# Patient Record
Sex: Female | Born: 1990 | Race: White | Hispanic: No | Marital: Single | Smoking: Former smoker
Health system: Southern US, Community
[De-identification: ages and names within clinical notes are randomized; demographics above are authoritative.]

## PROBLEM LIST (undated history)

## (undated) DIAGNOSIS — F329 Major depressive disorder, single episode, unspecified: Secondary | ICD-10-CM

## (undated) DIAGNOSIS — M5126 Other intervertebral disc displacement, lumbar region: Secondary | ICD-10-CM

## (undated) DIAGNOSIS — N809 Endometriosis, unspecified: Secondary | ICD-10-CM

## (undated) DIAGNOSIS — E559 Vitamin D deficiency, unspecified: Secondary | ICD-10-CM

## (undated) DIAGNOSIS — F4310 Post-traumatic stress disorder, unspecified: Secondary | ICD-10-CM

## (undated) DIAGNOSIS — F32A Depression, unspecified: Secondary | ICD-10-CM

## (undated) DIAGNOSIS — R7989 Other specified abnormal findings of blood chemistry: Secondary | ICD-10-CM

## (undated) DIAGNOSIS — F1911 Other psychoactive substance abuse, in remission: Secondary | ICD-10-CM

## (undated) DIAGNOSIS — N83209 Unspecified ovarian cyst, unspecified side: Secondary | ICD-10-CM

## (undated) DIAGNOSIS — T7840XA Allergy, unspecified, initial encounter: Secondary | ICD-10-CM

## (undated) HISTORY — DX: Allergy, unspecified, initial encounter: T78.40XA

## (undated) HISTORY — PX: TONSILLECTOMY AND ADENOIDECTOMY: SUR1326

## (undated) HISTORY — DX: Other intervertebral disc displacement, lumbar region: M51.26

## (undated) HISTORY — DX: Post-traumatic stress disorder, unspecified: F43.10

## (undated) HISTORY — DX: Depression, unspecified: F32.A

## (undated) HISTORY — PX: LUMBAR EPIDURAL INJECTION: SHX1980

## (undated) HISTORY — DX: Endometriosis, unspecified: N80.9

## (undated) HISTORY — DX: Other psychoactive substance abuse, in remission: F19.11

## (undated) HISTORY — PX: OTHER SURGICAL HISTORY: SHX169

---
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
## 2019-01-23 ENCOUNTER — Ambulatory Visit (INDEPENDENT_AMBULATORY_CARE_PROVIDER_SITE_OTHER): Payer: PRIVATE HEALTH INSURANCE | Admitting: Family Medicine

## 2019-01-23 ENCOUNTER — Encounter: Payer: Self-pay | Admitting: Family Medicine

## 2019-01-23 ENCOUNTER — Other Ambulatory Visit: Payer: Self-pay

## 2019-01-23 VITALS — BP 100/72 | HR 89 | Temp 98.6°F | Ht 70.0 in | Wt 246.0 lb

## 2019-01-23 DIAGNOSIS — R61 Generalized hyperhidrosis: Secondary | ICD-10-CM

## 2019-01-23 DIAGNOSIS — Z7689 Persons encountering health services in other specified circumstances: Secondary | ICD-10-CM

## 2019-01-23 DIAGNOSIS — R251 Tremor, unspecified: Secondary | ICD-10-CM

## 2019-01-23 DIAGNOSIS — E669 Obesity, unspecified: Secondary | ICD-10-CM

## 2019-01-23 DIAGNOSIS — F329 Major depressive disorder, single episode, unspecified: Secondary | ICD-10-CM

## 2019-01-23 DIAGNOSIS — F4310 Post-traumatic stress disorder, unspecified: Secondary | ICD-10-CM

## 2019-01-23 DIAGNOSIS — F32A Depression, unspecified: Secondary | ICD-10-CM

## 2019-01-23 DIAGNOSIS — N809 Endometriosis, unspecified: Secondary | ICD-10-CM | POA: Insufficient documentation

## 2019-01-23 DIAGNOSIS — R42 Dizziness and giddiness: Secondary | ICD-10-CM | POA: Diagnosis not present

## 2019-01-23 DIAGNOSIS — R5383 Other fatigue: Secondary | ICD-10-CM | POA: Diagnosis not present

## 2019-01-23 DIAGNOSIS — F1911 Other psychoactive substance abuse, in remission: Secondary | ICD-10-CM

## 2019-01-23 DIAGNOSIS — R253 Fasciculation: Secondary | ICD-10-CM

## 2019-01-23 MED ORDER — GABAPENTIN 300 MG PO CAPS: 600.0000 mg | ORAL_CAPSULE | Freq: Every day | ORAL | 2 refills | Status: DC

## 2019-01-23 NOTE — Progress Notes (Signed)
New Patient Office Visit  Assessment & Plan:  1. Fatigue, unspecified type - Lab work to look for any identifiable cause of symptoms. Patient did request an increase in Celexa if nothing was found.  - CBC with Differential/Platelet - CMP14+EGFR - TSH - VITAMIN D 25 Hydroxy (Vit-D Deficiency, Fractures)  2. Dizziness - Lab work to look for any identifiable cause of symptoms. Symptom does resolve when patient eats and she does not have very good eating habits. Discussed eating regular healthy meals.  - CBC with Differential/Platelet - CMP14+EGFR - TSH  3. Diaphoresis - Discussed that this can be a side effect of several of her medications which she does not wish to stop.  - CBC with Differential/Platelet - CMP14+EGFR - TSH  4. Shakiness - Lab work to look for any identifiable cause of symptoms. Symptom does resolve when patient eats and she does not have very good eating habits. Discussed eating regular healthy meals.  - CBC with Differential/Platelet - CMP14+EGFR - TSH  5. Muscle twitching - Magnesium  6. Endometriosis - gabapentin (NEURONTIN) 300 MG capsule; Take 2 capsules (600 mg total) by mouth at bedtime.  Dispense: 60 capsule; Refill: 2 - Ambulatory referral to Obstetrics / Gynecology  7. PTSD (post-traumatic stress disorder) - Managed by Dr. Hoyle Barr at [facility].  - citalopram (CELEXA) 20 MG tablet; Take 20 mg by mouth daily. - doxepin (SINEQUAN) 10 MG capsule; Take 1 capsule by mouth at bedtime. - hydrOXYzine (ATARAX/VISTARIL) 25 MG tablet; Take 25 mg by mouth 3 (three) times daily as needed.  8. Chronic depression - Managed by Dr. Hoyle Barr at [facility]. Patient requesting an increase in Celexa if lab work is unrevealing. I explained that I do not like to change medications that are prescribed by other providers. She is very upset and states she has been trying to get in touch with them to move up her appointment but has not been able to get anyone to call her back. Her next  appointment is not scheduled until the middle of September.  - citalopram (CELEXA) 20 MG tablet; Take 20 mg by mouth daily. - doxepin (SINEQUAN) 10 MG capsule; Take 1 capsule by mouth at bedtime. - hydrOXYzine (ATARAX/VISTARIL) 25 MG tablet; Take 25 mg by mouth 3 (three) times daily as needed.  9. History of drug abuse [facility]) - Patient feels she is ready to wean off the Methadone but that the place she is going does not offer a blind wean, which she feels she needs. I encouraged her to call other clinics and see her options. I did give her the names and telephone numbers of a couple.  - methadone (DOLOPHINE) 10 MG/ML solution; Take 7.5 mLs by mouth daily.  10. Encounter to establish care  11. Obesity - Discussed that we cannot just start her on medication to help with weight loss when she is not doing anything to help herself. Encouraged exercise and dietary changes, both of which I provided education on.    Follow-up: Return as directed after labs result.   Hendricks Limes, MSN, APRN, FNP-C [facility]  Subjective:  [name], female    DOB: 05-Apr-1991  Age: 28 y.o. MRN: [id number]  Patient Care Team: Loman [place], FNP as PCP - General (Family Medicine) Dr. Hoyle Barr as Consulting Physician (Psychiatry) [facility] as Consulting Physician  CC:  Chief Complaint  Patient presents with  . Establish Care  . Medication Refill    HPI Elaine Lopez presents to  establish care. Patient recently moved from [place]. Patient also is in need of a refill on her gabapentin.   Patient has been taking gabapentin for pelvic pain from her severe endometriosis. She reports it was initially helpful but she feels the pain is getting worse. She is not sure if the pain is actually getting worse or if she has gotten use to taking the gabapentin and needs a higher dose. This was previously prescribed by her OBGYN but she has not yet established with one  in [place].   Patient also reports for the past couple of months her amount of fatigue has increased significantly. She associates it with worsening of her depression. She does get dizzy and shaky frequently when she hasn't eaten, which does subside when she eats. She is also starting to experience migraines on the left side of her head which start with a feeling of the hairs sticking up and numbness. She also reports a new twitching of the muscles in her left leg.   She is concerned about her weight gain since she has been clean and is requesting medication to help her. She knows she needed to gain weight but now she is feeling bad about herself because she has gained too much. She does not want to use meth again but does occasionally think that it would help her lose weight. She is not doing any form of exercise because of the increase in her depression/fatigue. She drinks a protein shake for breakfast. Some days she eats healthy foods like fruit and yogurt, but other days she may eat cereal or ice cream for a meal. She feels like eats bad because of her depression.    Review of Systems  Constitutional: Positive for diaphoresis (since getting clean) and malaise/fatigue. Negative for chills, fever and weight loss.  HENT: Negative for congestion, ear discharge, ear pain, nosebleeds, sinus pain, sore throat and tinnitus.   Eyes: Negative for blurred vision, double vision, pain, discharge and redness.  Respiratory: Negative for cough, shortness of breath and wheezing.   Cardiovascular: Negative for chest pain, palpitations and leg swelling.  Gastrointestinal: Negative for abdominal pain, constipation, diarrhea, heartburn, nausea and vomiting.  Genitourinary: Negative for dysuria, frequency and urgency.  Musculoskeletal: Negative for myalgias.  Skin: Negative for rash.  Neurological: Positive for dizziness and headaches. Negative for seizures and weakness.  Psychiatric/Behavioral: Positive for depression.  Negative for substance abuse and suicidal ideas. The patient is not nervous/anxious.      Current Outpatient Medications:  .  citalopram (CELEXA) 20 MG tablet, Take 20 mg by mouth daily., Disp: , Rfl:  .  doxepin (SINEQUAN) 10 MG capsule, Take 1 capsule by mouth at bedtime., Disp: , Rfl:  .  gabapentin (NEURONTIN) 300 MG capsule, Take 2 capsules (600 mg total) by mouth at bedtime., Disp: 60 capsule, Rfl: 2 .  hydrOXYzine (ATARAX/VISTARIL) 25 MG tablet, Take 25 mg by mouth 3 (three) times daily as needed., Disp: , Rfl:  .  Melatonin 10 MG CAPS, Take 1 capsule by mouth at bedtime., Disp: , Rfl:  .  methadone (DOLOPHINE) 10 MG/ML solution, Take 7.5 mLs by mouth daily., Disp: , Rfl:  .  Multiple Vitamin (MULTIVITAMIN WITH MINERALS) TABS tablet, Take 1 tablet by mouth daily., Disp: , Rfl:   No Known Allergies  Past Medical History:  Diagnosis Date  . Allergy   . Chronic depression   . Endometriosis    stage 4   . History of drug abuse ([place])   .  Lumbar herniated disc    x3  . PTSD (post-traumatic stress disorder)     Past Surgical History:  Procedure Laterality Date  . laproscopy     x3  . LUMBAR EPIDURAL INJECTION    . TONSILLECTOMY AND ADENOIDECTOMY      Family History  Problem Relation Age of Onset  . Thyroid disease Mother   . Hypertension Mother   . Depression Mother   . Vascular Disease Mother   . Depression Father   . Cancer Sister        ewings sarcoma  . Heart attack Maternal Grandfather   . COPD Paternal Grandfather   . Heart disease Paternal Grandfather     Social History   Socioeconomic History  . Marital status: Single    Spouse name: Not on file  . Number of children: 0  . Years of education: Not on file  . 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 Financial resource strain: Not on file  . Food insecurity    Worry: Not on file    Inability: Not on file  . Transportation needs    Medical:  Not on file    Non-medical: Not on file  Tobacco Use  . Smoking status: Current Some Day Smoker    Packs/day: 0.10  . Smokeless tobacco: Never Used  . Tobacco comment: maybe one a day   Substance and Sexual Activity  . Alcohol use: Not Currently  . 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  Marland Kitchen Sexual activity: Not on file  Lifestyle  . Physical activity    Days per week: Not on file    Minutes per session: Not on file  . Stress: Not on file  Relationships  . Social Herbalist on phone: Not on file    Gets together: Not on file    Attends religious service: Not on file    Active member of club or organization: Not on file    Attends meetings of clubs or organizations: Not on file    Relationship status: Not on file  . Intimate partner violence    Fear of current or ex partner: Not on file    Emotionally abused: Not on file    Physically abused: Not on file    Forced sexual activity: Not on file  Other Topics Concern  . Not on file  Social History Narrative  . Not on file    Objective:   Today's Vitals: BP 100/72   Pulse 89   Temp 98.6 F (37 C)   Ht _0  (1.778 m)   Wt 246 lb (111.6 kg)   LMP 01/09/2019 Comment: IUD   BMI 35.30 kg/m   Physical Exam Vitals signs reviewed.  Constitutional:      General: She is not in acute distress.    Appearance: Normal appearance. She is obese. She is not ill-appearing, toxic-appearing or diaphoretic.  HENT:     Head: Normocephalic and atraumatic.     Right Ear: Tympanic membrane, ear canal and external ear normal. There is no impacted cerumen.     Left Ear: Tympanic membrane, ear canal and external ear normal. There is no impacted cerumen.     Nose: Nose normal. No congestion or rhinorrhea.     Mouth/Throat:     Mouth: Mucous membranes are moist.  Pharynx: Oropharynx is clear. No oropharyngeal exudate or posterior oropharyngeal erythema.  Eyes:      General: No scleral icterus.       Right eye: No discharge.        Left eye: No discharge.     Conjunctiva/sclera: Conjunctivae normal.     Pupils: Pupils are equal, round, and reactive to light.  Neck:     Musculoskeletal: Normal range of motion and neck supple. No neck rigidity or muscular tenderness.  Cardiovascular:     Rate and Rhythm: Normal rate and regular rhythm.     Heart sounds: Normal heart sounds. No murmur. No friction rub. No gallop.   Pulmonary:     Effort: Pulmonary effort is normal. No respiratory distress.     Breath sounds: Normal breath sounds. No stridor. No wheezing, rhonchi or rales.  Abdominal:     General: Abdomen is flat. Bowel sounds are normal. There is no distension.     Palpations: Abdomen is soft. There is no mass.     Tenderness: There is no abdominal tenderness. There is no guarding or rebound.     Hernia: No hernia is present.  Musculoskeletal: Normal range of motion.  Lymphadenopathy:     Cervical: No cervical adenopathy.  Skin:    General: Skin is warm and dry.     Capillary Refill: Capillary refill takes less than 2 seconds.  Neurological:     General: No focal deficit present.     Mental Status: She is alert and oriented to person, place, and time. Mental status is at baseline.  Psychiatric:        Mood and Affect: Mood normal.        Behavior: Behavior normal.        Thought Content: Thought content normal.        Judgment: Judgment normal.

## 2019-01-23 NOTE — Patient Instructions (Addendum)
[facility] [place] [phone]  [facility] [place] - Dr. Tollie EthPlummer [phone]  Calorie Counting for Weight Loss Calories are units of energy. Your body needs a certain amount of calories from food to keep you going throughout the day. When you eat more calories than your body needs, your body stores the extra calories as fat. When you eat fewer calories than your body needs, your body burns fat to get the energy it needs. Calorie counting means keeping track of how many calories you eat and drink each day. Calorie counting can be helpful if you need to lose weight. If you make sure to eat fewer calories than your body needs, you should lose weight. Ask your health care provider what a healthy weight is for you. For calorie counting to work, you will need to eat the right number of calories in a day in order to lose a healthy amount of weight per week. A dietitian can help you determine how many calories you need in a day and will give you suggestions on how to reach your calorie goal.  A healthy amount of weight to lose per week is usually 1-2 lb (0.5-0.9 kg). This usually means that your daily calorie intake should be reduced by 500-750 calories.  Eating 1,200 - 1,500 calories per day can help most women lose weight.  Eating 1,500 - 1,800 calories per day can help most men lose weight. What is my plan? My goal is to have __________ calories per day. If I have this many calories per day, I should lose around __________ pounds per week. What do I need to know about calorie counting? In order to meet your daily calorie goal, you will need to:  Find out how many calories are in each food you would like to eat. Try to do this before you eat.  Decide how much of the food you plan to eat.  Write down what you ate and how many calories it had. Doing this is called keeping a food log. To successfully lose weight, it is important to balance calorie counting  with a healthy lifestyle that includes regular activity. Aim for 150 minutes of moderate exercise (such as walking) or 75 minutes of vigorous exercise (such as running) each week. Where do I find calorie information?  The number of calories in a food can be found on a Nutrition Facts label. If a food does not have a Nutrition Facts label, try to look up the calories online or ask your dietitian for help. Remember that calories are listed per serving. If you choose to have more than one serving of a food, you will have to multiply the calories per serving by the amount of servings you plan to eat. For example, the label on a package of bread might say that a serving size is 1 slice and that there are 90 calories in a serving. If you eat 1 slice, you will have eaten 90 calories. If you eat 2 slices, you will have eaten 180 calories. How do I keep a food log? Immediately after each meal, record the following information in your food log:  What you ate. Don't forget to include toppings, sauces, and other extras on the food.  How much you ate. This can be measured in cups, ounces, or number of items.  How many calories each food and drink had.  The total number of calories in the meal. Keep your food log near  you, such as in a small notebook in your pocket, or use a mobile app or website. Some programs will calculate calories for you and show you how many calories you have left for the day to meet your goal. What are some calorie counting tips?   Use your calories on foods and drinks that will fill you up and not leave you hungry: ? Some examples of foods that fill you up are nuts and nut butters, vegetables, lean proteins, and high-fiber foods like whole grains. High-fiber foods are foods with more than 5 g fiber per serving. ? Drinks such as sodas, specialty coffee drinks, alcohol, and juices have a lot of calories, yet do not fill you up.  Eat nutritious foods and avoid empty calories. Empty  calories are calories you get from foods or beverages that do not have many vitamins or protein, such as candy, sweets, and soda. It is better to have a nutritious high-calorie food (such as an avocado) than a food with few nutrients (such as a bag of chips).  Know how many calories are in the foods you eat most often. This will help you calculate calorie counts faster.  Pay attention to calories in drinks. Low-calorie drinks include water and unsweetened drinks.  Pay attention to nutrition labels for "low fat" or "fat free" foods. These foods sometimes have the same amount of calories or more calories than the full fat versions. They also often have added sugar, starch, or salt, to make up for flavor that was removed with the fat.  Find a way of tracking calories that works for you. Get creative. Try different apps or programs if writing down calories does not work for you. What are some portion control tips?  Know how many calories are in a serving. This will help you know how many servings of a certain food you can have.  Use a measuring cup to measure serving sizes. You could also try weighing out portions on a kitchen scale. With time, you will be able to estimate serving sizes for some foods.  Take some time to put servings of different foods on your favorite plates, bowls, and cups so you know what a serving looks like.  Try not to eat straight from a bag or box. Doing this can lead to overeating. Put the amount you would like to eat in a cup or on a plate to make sure you are eating the right portion.  Use smaller plates, glasses, and bowls to prevent overeating.  Try not to multitask (for example, watch TV or use your computer) while eating. If it is time to eat, sit down at a table and enjoy your food. This will help you to know when you are full. It will also help you to be aware of what you are eating and how much you are eating. What are tips for following this plan? Reading food  labels  Check the calorie count compared to the serving size. The serving size may be smaller than what you are used to eating.  Check the source of the calories. Make sure the food you are eating is high in vitamins and protein and low in saturated and trans fats. Shopping  Read nutrition labels while you shop. This will help you make healthy decisions before you decide to purchase your food.  Make a grocery list and stick to it. Cooking  Try to cook your favorite foods in a healthier way. For example, try baking instead of  frying.  Use low-fat dairy products. Meal planning  Use more fruits and vegetables. Half of your plate should be fruits and vegetables.  Include lean proteins like poultry and fish. How do I count calories when eating out?  Ask for smaller portion sizes.  Consider sharing an entree and sides instead of getting your own entree.  If you get your own entree, eat only half. Ask for a box at the beginning of your meal and put the rest of your entree in it so you are not tempted to eat it.  If calories are listed on the menu, choose the lower calorie options.  Choose dishes that include vegetables, fruits, whole grains, low-fat dairy products, and lean protein.  Choose items that are boiled, broiled, grilled, or steamed. Stay away from items that are buttered, battered, fried, or served with cream sauce. Items labeled "crispy" are usually fried, unless stated otherwise.  Choose water, low-fat milk, unsweetened iced tea, or other drinks without added sugar. If you want an alcoholic beverage, choose a lower calorie option such as a glass of wine or light beer.  Ask for dressings, sauces, and syrups on the side. These are usually high in calories, so you should limit the amount you eat.  If you want a salad, choose a garden salad and ask for grilled meats. Avoid extra toppings like bacon, cheese, or fried items. Ask for the dressing on the side, or ask for olive oil  and vinegar or lemon to use as dressing.  Estimate how many servings of a food you are given. For example, a serving of cooked rice is  cup or about the size of half a baseball. Knowing serving sizes will help you be aware of how much food you are eating at restaurants. The list below tells you how big or small some common portion sizes are based on everyday objects: ? 1 oz-4 stacked dice. ? 3 oz-1 deck of cards. ? 1 tsp-1 die. ? 1 Tbsp- a ping-pong ball. ? 2 Tbsp-1 ping-pong ball. ?  cup- baseball. ? 1 cup-1 baseball. Summary  Calorie counting means keeping track of how many calories you eat and drink each day. If you eat fewer calories than your body needs, you should lose weight.  A healthy amount of weight to lose per week is usually 1-2 lb (0.5-0.9 kg). This usually means reducing your daily calorie intake by 500-750 calories.  The number of calories in a food can be found on a Nutrition Facts label. If a food does not have a Nutrition Facts label, try to look up the calories online or ask your dietitian for help.  Use your calories on foods and drinks that will fill you up, and not on foods and drinks that will leave you hungry.  Use smaller plates, glasses, and bowls to prevent overeating. This information is not intended to replace advice given to you by your health care provider. Make sure you discuss any questions you have with your health care provider. Document Released: 06/04/2005 Document Revised: 02/21/2018 Document Reviewed: 05/04/2016 Elsevier Patient Education  2020 [employer].  Exercising to Lose Weight Exercise is structured, repetitive physical activity to improve fitness and health. Getting regular exercise is important for everyone. It is especially important if you are overweight. Being overweight increases your risk of heart disease, stroke, diabetes, high blood pressure, and several types of cancer. Reducing your calorie intake and exercising can help you  lose weight. Exercise is usually categorized as moderate or  vigorous intensity. To lose weight, most people need to do a certain amount of moderate-intensity or vigorous-intensity exercise each week. Moderate-intensity exercise  Moderate-intensity exercise is any activity that gets you moving enough to burn at least three times more energy (calories) than if you were sitting. Examples of moderate exercise include:  Walking a mile in 15 minutes.  Doing light yard work.  Biking at an easy pace. Most people should get at least 150 minutes (2 hours and 30 minutes) a week of moderate-intensity exercise to maintain their body weight. Vigorous-intensity exercise Vigorous-intensity exercise is any activity that gets you moving enough to burn at least six times more calories than if you were sitting. When you exercise at this intensity, you should be working hard enough that you are not able to carry on a conversation. Examples of vigorous exercise include:  Running.  Playing a team sport, such as football, basketball, and soccer.  Jumping rope. Most people should get at least 75 minutes (1 hour and 15 minutes) a week of vigorous-intensity exercise to maintain their body weight. How can exercise affect me? When you exercise enough to burn more calories than you eat, you lose weight. Exercise also reduces body fat and builds muscle. The more muscle you have, the more calories you burn. Exercise also:  Improves mood.  Reduces stress and tension.  Improves your overall fitness, flexibility, and endurance.  Increases bone strength. The amount of exercise you need to lose weight depends on:  Your age.  The type of exercise.  Any health conditions you have.  Your overall physical ability. Talk to your health care provider about how much exercise you need and what types of activities are safe for you. What actions can I take to lose weight? Nutrition   Make changes to your diet as told  by your health care provider or diet and nutrition specialist (dietitian). This may include: ? Eating fewer calories. ? Eating more protein. ? Eating less unhealthy fats. ? Eating a diet that includes fresh fruits and vegetables, whole grains, low-fat dairy products, and lean protein. ? Avoiding foods with added fat, salt, and sugar.  Drink plenty of water while you exercise to prevent dehydration or heat stroke. Activity  Choose an activity that you enjoy and set realistic goals. Your health care provider can help you make an exercise plan that works for you.  Exercise at a moderate or vigorous intensity most days of the week. ? The intensity of exercise may vary from person to person. You can tell how intense a workout is for you by paying attention to your breathing and heartbeat. Most people will notice their breathing and heartbeat get faster with more intense exercise.  Do resistance training twice each week, such as: ? Push-ups. ? Sit-ups. ? Lifting weights. ? Using resistance bands.  Getting short amounts of exercise can be just as helpful as long structured periods of exercise. If you have trouble finding time to exercise, try to include exercise in your daily routine. ? Get up, stretch, and walk around every 30 minutes throughout the day. ? Go for a walk during your lunch break. ? Park your car farther away from your destination. ? If you take public transportation, get off one stop early and walk the rest of the way. ? Make phone calls while standing up and walking around. ? Take the stairs instead of elevators or escalators.  Wear comfortable clothes and shoes with good support.  Do not exercise so much  that you hurt yourself, feel dizzy, or get very short of breath. Where to find more information  U.S. Department of Health and Human Services: ThisPath.fiwww.hhs.gov  Centers for Disease Control and Prevention (CDC): FootballExhibition.com.brwww.cdc.gov Contact a health care provider:  Before starting  a new exercise program.  If you have questions or concerns about your weight.  If you have a medical problem that keeps you from exercising. Get help right away if you have any of the following while exercising:  Injury.  Dizziness.  Difficulty breathing or shortness of breath that does not go away when you stop exercising.  Chest pain.  Rapid heartbeat. Summary  Being overweight increases your risk of heart disease, stroke, diabetes, high blood pressure, and several types of cancer.  Losing weight happens when you burn more calories than you eat.  Reducing the amount of calories you eat in addition to getting regular moderate or vigorous exercise each week helps you lose weight. This information is not intended to replace advice given to you by your health care provider. Make sure you discuss any questions you have with your health care provider. Document Released: 07/07/2010 Document Revised: 06/17/2017 Document Reviewed: 06/17/2017 Elsevier Patient Education  2020 [employer]

## 2019-01-24 ENCOUNTER — Encounter: Payer: Self-pay | Admitting: Family Medicine

## 2019-01-24 DIAGNOSIS — F32A Depression, unspecified: Secondary | ICD-10-CM | POA: Insufficient documentation

## 2019-01-24 DIAGNOSIS — F4310 Post-traumatic stress disorder, unspecified: Secondary | ICD-10-CM | POA: Insufficient documentation

## 2019-01-24 DIAGNOSIS — F329 Major depressive disorder, single episode, unspecified: Secondary | ICD-10-CM | POA: Insufficient documentation

## 2019-01-24 DIAGNOSIS — F1911 Other psychoactive substance abuse, in remission: Secondary | ICD-10-CM | POA: Insufficient documentation

## 2019-01-24 LAB — CMP14+EGFR
ALT: 26 IU/L (ref 0–32)
AST: 30 IU/L (ref 0–40)
Albumin/Globulin Ratio: 1.7 (ref 1.2–2.2)
Albumin: 4.5 g/dL (ref 3.9–5.0)
Alkaline Phosphatase: 113 IU/L (ref 39–117)
BUN/Creatinine Ratio: 19 (ref 9–23)
BUN: 11 mg/dL (ref 6–20)
Bilirubin Total: <0.2 mg/dL (ref 0.0–1.2)
CO2: 22 mmol/L (ref 20–29)
Calcium: 9.7 mg/dL (ref 8.7–10.2)
Chloride: 100 mmol/L (ref 96–106)
Creatinine, Ser: 0.59 mg/dL (ref 0.57–1.00)
GFR calc Af Amer: 146 mL/min/{1.73_m2} (ref >59)
GFR calc non Af Amer: 127 mL/min/{1.73_m2} (ref >59)
Globulin, Total: 2.7 g/dL (ref 1.5–4.5)
Glucose: 94 mg/dL (ref 65–99)
Potassium: 4.5 mmol/L (ref 3.5–5.2)
Sodium: 137 mmol/L (ref 134–144)
Total Protein: 7.2 g/dL (ref 6.0–8.5)

## 2019-01-24 LAB — CBC WITH DIFFERENTIAL/PLATELET
Basophils Absolute: 0.1 10*3/uL (ref 0.0–0.2)
Basos: 1 %
EOS (ABSOLUTE): 0.3 10*3/uL (ref 0.0–0.4)
Eos: 5 %
Hematocrit: 37.1 % (ref 34.0–46.6)
Hemoglobin: 12.7 g/dL (ref 11.1–15.9)
Immature Grans (Abs): 0 10*3/uL (ref 0.0–0.1)
Immature Granulocytes: 0 %
Lymphocytes Absolute: 2.4 10*3/uL (ref 0.7–3.1)
Lymphs: 36 %
MCH: 28.4 pg (ref 26.6–33.0)
MCHC: 34.2 g/dL (ref 31.5–35.7)
MCV: 83 fL (ref 79–97)
Monocytes Absolute: 0.7 10*3/uL (ref 0.1–0.9)
Monocytes: 11 %
Neutrophils Absolute: 3.1 10*3/uL (ref 1.4–7.0)
Neutrophils: 47 %
Platelets: 332 10*3/uL (ref 150–450)
RBC: 4.47 x10E6/uL (ref 3.77–5.28)
RDW: 12.1 % (ref 11.7–15.4)
WBC: 6.5 10*3/uL (ref 3.4–10.8)

## 2019-01-24 LAB — VITAMIN D 25 HYDROXY (VIT D DEFICIENCY, FRACTURES): Vit D, 25-Hydroxy: 27.8 ng/mL — ABNORMAL LOW (ref 30.0–100.0)

## 2019-01-24 LAB — TSH: TSH: 6.22 u[IU]/mL — ABNORMAL HIGH (ref 0.450–4.500)

## 2019-01-24 LAB — MAGNESIUM: Magnesium: 1.8 mg/dL (ref 1.6–2.3)

## 2019-01-26 ENCOUNTER — Encounter: Payer: Self-pay | Admitting: Family Medicine

## 2019-01-26 DIAGNOSIS — R7989 Other specified abnormal findings of blood chemistry: Secondary | ICD-10-CM

## 2019-01-26 DIAGNOSIS — E559 Vitamin D deficiency, unspecified: Secondary | ICD-10-CM

## 2019-01-26 HISTORY — DX: Other specified abnormal findings of blood chemistry: R79.89

## 2019-01-26 HISTORY — DX: Vitamin D deficiency, unspecified: E55.9

## 2019-01-27 ENCOUNTER — Telehealth: Payer: Self-pay | Admitting: Family Medicine

## 2019-01-27 DIAGNOSIS — F4310 Post-traumatic stress disorder, unspecified: Secondary | ICD-10-CM

## 2019-01-27 DIAGNOSIS — F329 Major depressive disorder, single episode, unspecified: Secondary | ICD-10-CM

## 2019-01-27 DIAGNOSIS — F32A Depression, unspecified: Secondary | ICD-10-CM

## 2019-01-27 DIAGNOSIS — N809 Endometriosis, unspecified: Secondary | ICD-10-CM

## 2019-01-27 MED ORDER — CITALOPRAM HYDROBROMIDE 40 MG PO TABS: 40.0000 mg | ORAL_TABLET | Freq: Every day | ORAL | 1 refills | Status: DC

## 2019-01-27 NOTE — Addendum Note (Signed)
Addended by: Loman Brooklyn on: 01/27/2019 03:40 PM   Modules accepted: Orders

## 2019-01-27 NOTE — Telephone Encounter (Signed)
Patient called regarding lab result message received on MyChart.  Patient states that Celexa 10mg  was changed to 20mg  on 11/26/2018.  She also states that she would like to increase gabapentin from 600mg  to 900mg  and would like to know since labs have  came back can she be started on weight loss medication.   [facility] and would like to know if provider can recommend another doctor.

## 2019-01-27 NOTE — Telephone Encounter (Signed)
Patient aware.

## 2019-01-27 NOTE — Telephone Encounter (Signed)
I am willing to increase the Celexa to 40 mg once daily but she should make sure to follow-up with [facility]. I sent in a 40 mg tablet but she can take two of the 20 mg tablets to use them up. Just make sure she knows when she goes to pick up the new prescription she only needs to take one tablet since it is the 40 mg.   I referred to [facility] for pelvic pain as I have never treated endometriosis with gabapentin and wanted their input. I will place referral to [facility] in [place] since [place] does not accept her insurance.   She needs to work on her diet and incorporating exercise into her routine. The medication only helps if she is doing these things. She has to be trying before these medications will be prescribed. We can do a follow-up in 3 months to see how she has done with diet and exercise. I am sorry, I thought I explained that well enough when she was here.

## 2019-02-10 ENCOUNTER — Telehealth: Payer: Self-pay | Admitting: Family Medicine

## 2019-04-20 ENCOUNTER — Other Ambulatory Visit: Payer: Self-pay | Admitting: Family Medicine

## 2019-04-20 DIAGNOSIS — N809 Endometriosis, unspecified: Secondary | ICD-10-CM

## 2019-04-20 DIAGNOSIS — F329 Major depressive disorder, single episode, unspecified: Secondary | ICD-10-CM

## 2019-04-20 DIAGNOSIS — F4310 Post-traumatic stress disorder, unspecified: Secondary | ICD-10-CM

## 2019-04-20 DIAGNOSIS — F32A Depression, unspecified: Secondary | ICD-10-CM

## 2019-04-20 NOTE — Telephone Encounter (Signed)
Has she seen an OBGYN? She was supposed to see them for her endometriosis as this is what she was taking the gabapentin for. Also, has she seen her psychiatrist? I increased and she was to follow-up with him. Please let me know so I can figure out refills.

## 2019-04-21 MED ORDER — CITALOPRAM HYDROBROMIDE 40 MG PO TABS: 40.0000 mg | ORAL_TABLET | Freq: Every day | ORAL | 2 refills | Status: DC

## 2019-04-21 MED ORDER — GABAPENTIN 300 MG PO CAPS: 600.0000 mg | ORAL_CAPSULE | Freq: Every day | ORAL | 2 refills | Status: DC

## 2019-04-21 NOTE — Telephone Encounter (Signed)
Patient has not been able to se OBGYN & psychiatrist due to her work scheduled.

## 2019-04-21 NOTE — Telephone Encounter (Signed)
Refills sent

## 2019-04-23 NOTE — Telephone Encounter (Signed)
Can we please ask patient to at least schedule a f/u of depression with me since she has not made it to her psychiatrist? We can do this as a telephone visit if she is agreeable.

## 2019-04-23 NOTE — Telephone Encounter (Signed)
Pt aware and will call back tomorrow and schedule an appt

## 2019-05-29 ENCOUNTER — Telehealth: Payer: Self-pay | Admitting: Family Medicine

## 2019-05-29 ENCOUNTER — Other Ambulatory Visit: Payer: Self-pay | Admitting: Family Medicine

## 2019-05-29 DIAGNOSIS — F32A Depression, unspecified: Secondary | ICD-10-CM

## 2019-05-29 DIAGNOSIS — F4310 Post-traumatic stress disorder, unspecified: Secondary | ICD-10-CM

## 2019-05-29 DIAGNOSIS — F329 Major depressive disorder, single episode, unspecified: Secondary | ICD-10-CM

## 2019-05-29 MED ORDER — HYDROXYZINE HCL 25 MG PO TABS: 25.0000 mg | ORAL_TABLET | Freq: Three times a day (TID) | ORAL | 0 refills | Status: DC | PRN

## 2019-05-29 NOTE — Telephone Encounter (Signed)
Sent.  Please have f/u w/ PCP scheduled

## 2019-06-01 ENCOUNTER — Ambulatory Visit (INDEPENDENT_AMBULATORY_CARE_PROVIDER_SITE_OTHER): Payer: PRIVATE HEALTH INSURANCE | Admitting: Family Medicine

## 2019-06-01 ENCOUNTER — Encounter: Payer: Self-pay | Admitting: Family Medicine

## 2019-06-01 DIAGNOSIS — F329 Major depressive disorder, single episode, unspecified: Secondary | ICD-10-CM | POA: Diagnosis not present

## 2019-06-01 DIAGNOSIS — F4310 Post-traumatic stress disorder, unspecified: Secondary | ICD-10-CM

## 2019-06-01 DIAGNOSIS — F1911 Other psychoactive substance abuse, in remission: Secondary | ICD-10-CM

## 2019-06-01 DIAGNOSIS — E669 Obesity, unspecified: Secondary | ICD-10-CM | POA: Diagnosis not present

## 2019-06-01 DIAGNOSIS — F32A Depression, unspecified: Secondary | ICD-10-CM

## 2019-06-01 MED ORDER — BUPROPION HCL ER (XL) 150 MG PO TB24: 150.0000 mg | ORAL_TABLET | Freq: Every day | ORAL | 2 refills | Status: DC

## 2019-06-01 NOTE — Progress Notes (Signed)
Virtual Visit via Telephone Note  I connected with Elaine Lopez on 06/01/19 at 1:46 PM by telephone and verified that I am speaking with the correct person using two identifiers. Elaine Lopez is currently located at home and nobody is currently with her during this visit. The provider, Loman Brooklyn, FNP is located in their home at time of visit.  I discussed the limitations, risks, security and privacy concerns of performing an evaluation and management service by telephone and the availability of in person appointments. I also discussed with the patient that there may be a patient responsible charge related to this service. The patient expressed understanding and agreed to proceed.  Subjective: PCP: Loman Brooklyn, FNP  Chief Complaint  Patient presents with  . Depression   Patient reports he feels like her depression is doing better with an increase in Celexa.  She reports she has also been seeing a chiropractor and since she is having a lot less pain it has boosted her mood.  In addition she has got a puppy which is also helping.  She reports she still has moments where she just sits and stares feeling like she is reliving the past which makes her feel down but she is able to pull herself out of it.  She does still have nightmares at times.  She reports she cannot recall the entire nightmare the next day but will sometimes have what she feels like are flashbacks to the nightmares which are very scary for her.  She spoke to [facility] today and reports that she was told she has 30 days to get in and do an updated assessment at that point they will set up an appointment with Dr. Hoyle Barr and a new counselor for her.  She admits that she does have trouble getting appointments made as she does not get her work schedule until either Thursday, Friday, or Saturday when the schedule starts on Sunday.  She is afraid to put in time off and advance as she reports they tried to get back at her for  this.  She is still concerned about her weight.  She is walking multiple times a day with her dog.  She make sure that 1 walk is a mile and then the other 2 walks are 15 to 20 minutes each.  She does not have the best appetite but reports that she is trying to eat better.  She eats a lot of cereal, yogurt, and protein shakes.  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  She is interested in weaning off of the methadone and has plans with her counselor to start this process once she returns from [place] where she is going this weekend.   Depression screen [facility] 2/9 06/01/2019 01/23/2019  Decreased Interest 1 3  Down, Depressed, Hopeless 2 1  PHQ - 2 Score 3 4  Altered sleeping 3 2  Tired, decreased energy 3 3  Change in appetite 3 1  Feeling bad or failure about yourself  1 0  Trouble concentrating 3 2  Moving slowly or fidgety/restless 2 2  Suicidal thoughts 0 0  PHQ-9 Score 18 14  Difficult doing work/chores Very difficult Extremely dIfficult    ROS: Per HPI  Current Outpatient Medications:  .  citalopram (CELEXA) 40 MG tablet, Take 1 tablet (40 mg total) by mouth daily., Disp: 30 tablet, Rfl: 2 .  doxepin (  SINEQUAN) 10 MG capsule, Take 1 capsule by mouth at bedtime., Disp: , Rfl:  .  gabapentin (NEURONTIN) 300 MG capsule, Take 2 capsules (600 mg total) by mouth at bedtime., Disp: 60 capsule, Rfl: 2 .  hydrOXYzine (ATARAX/VISTARIL) 25 MG tablet, Take 1 tablet (25 mg total) by mouth 3 (three) times daily as needed. Needs OV, Disp: 90 tablet, Rfl: 0 .  Melatonin 10 MG CAPS, Take 1 capsule by mouth at bedtime., Disp: , Rfl:  .  methadone (DOLOPHINE) 10 MG/ML solution, Take 7.5 mLs by mouth daily., Disp: , Rfl:  .  Multiple Vitamin (MULTIVITAMIN WITH MINERALS) TABS tablet, Take 1 tablet by mouth daily., Disp: , Rfl:   No Known Allergies Past Medical History:  Diagnosis Date  . Abnormal TSH 01/26/2019  .  Allergy   . Chronic depression   . Endometriosis    stage 4   . History of drug abuse (HCC)   . Lumbar herniated disc    x3  . PTSD (post-traumatic stress disorder)   . Vitamin D insufficiency 01/26/2019    Observations/Objective: A&O  No respiratory distress or wheezing audible over the phone Mood, judgement, and thought processes all WNL  Assessment and Plan: 1-2. Chronic depression/PTSD (post-traumatic stress disorder) - Patient feels she is improving although her PHQ-9 score does not reflect this.  Patient to continue Celexa 40 mg once daily and hydroxyzine 25 mg 3 times daily as needed.  We are adding Wellbutrin 150 mg once daily today.  She is going to go for an updated assessment at [facility] and get back in with Dr. Tacey Ruiz get a new counselor. - buPROPion (WELLBUTRIN XL) 150 MG 24 hr tablet; Take 1 tablet (150 mg total) by mouth daily.  Dispense: 30 tablet; Refill: 2  3. Obesity (BMI 30-39.9) - Encouraged her to continue exercise and dietary improvement.  Wellbutrin added today to help with depression and hopefully her weight is well.  She is going to start weaning off of methadone when she returns from [place] which hopefully will also help with her weight. - buPROPion (WELLBUTRIN XL) 150 MG 24 hr tablet; Take 1 tablet (150 mg total) by mouth daily.  Dispense: 30 tablet; Refill: 2  4. History of drug abuse [facility]) - Patient has a counselor that she sees where she gets her methadone.  She does have plans to wean off of methadone in the near future.   Follow Up Instructions:  Return in about 2 months (around 08/02/2019) for depression/weight.  I discussed the assessment and treatment plan with the patient. The patient was provided an opportunity to ask questions and all were answered. The patient agreed with the plan and demonstrated an understanding of the instructions.   The patient was advised to call back or seek an in-person evaluation if the symptoms worsen or if the  condition fails to improve as anticipated.  The above assessment and management plan was discussed with the patient. The patient verbalized understanding of and has agreed to the management plan. Patient is aware to call the clinic if symptoms persist or worsen. Patient is aware when to return to the clinic for a follow-up visit. Patient educated on when it is appropriate to go to the emergency department.   Time call ended: 2:13 PM  I provided 30 minutes of non-face-to-face time during this encounter.  Deliah Boston, MSN, APRN, FNP-C [facility] 06/01/19

## 2019-07-23 ENCOUNTER — Ambulatory Visit (INDEPENDENT_AMBULATORY_CARE_PROVIDER_SITE_OTHER): Payer: 59 | Admitting: Family Medicine

## 2019-07-23 ENCOUNTER — Telehealth: Payer: Self-pay | Admitting: Family Medicine

## 2019-07-23 ENCOUNTER — Encounter: Payer: Self-pay | Admitting: Family Medicine

## 2019-07-23 DIAGNOSIS — F1193 Opioid use, unspecified with withdrawal: Secondary | ICD-10-CM

## 2019-07-23 DIAGNOSIS — N809 Endometriosis, unspecified: Secondary | ICD-10-CM

## 2019-07-23 DIAGNOSIS — F1123 Opioid dependence with withdrawal: Secondary | ICD-10-CM | POA: Diagnosis not present

## 2019-07-23 DIAGNOSIS — F4310 Post-traumatic stress disorder, unspecified: Secondary | ICD-10-CM | POA: Diagnosis not present

## 2019-07-23 DIAGNOSIS — F32A Depression, unspecified: Secondary | ICD-10-CM

## 2019-07-23 DIAGNOSIS — F411 Generalized anxiety disorder: Secondary | ICD-10-CM

## 2019-07-23 DIAGNOSIS — F329 Major depressive disorder, single episode, unspecified: Secondary | ICD-10-CM | POA: Diagnosis not present

## 2019-07-23 MED ORDER — CITALOPRAM HYDROBROMIDE 40 MG PO TABS: 40.0000 mg | ORAL_TABLET | Freq: Every day | ORAL | 1 refills | Status: DC

## 2019-07-23 MED ORDER — BUPROPION HCL ER (XL) 300 MG PO TB24: 300.0000 mg | ORAL_TABLET | Freq: Every day | ORAL | 2 refills | Status: DC

## 2019-07-23 MED ORDER — METHOCARBAMOL 750 MG PO TABS: 750.0000 mg | ORAL_TABLET | Freq: Four times a day (QID) | ORAL | 1 refills | Status: DC | PRN

## 2019-07-23 MED ORDER — PROMETHAZINE HCL 25 MG PO TABS: 25.0000 mg | ORAL_TABLET | ORAL | 0 refills | Status: DC | PRN

## 2019-07-23 MED ORDER — HYDROXYZINE HCL 25 MG PO TABS: 25.0000 mg | ORAL_TABLET | Freq: Three times a day (TID) | ORAL | 1 refills | Status: DC | PRN

## 2019-07-23 MED ORDER — GABAPENTIN 300 MG PO CAPS: 600.0000 mg | ORAL_CAPSULE | Freq: Every day | ORAL | 1 refills | Status: DC

## 2019-07-23 MED ORDER — DOXEPIN HCL 10 MG PO CAPS: 10.0000 mg | ORAL_CAPSULE | Freq: Every day | ORAL | 2 refills | Status: DC

## 2019-07-23 NOTE — Progress Notes (Signed)
Virtual Visit via Telephone Note  I connected with Elaine Lopez on 07/23/19 at 1:22 PM by telephone and verified that I am speaking with the correct person using two identifiers. Elaine Lopez is currently located at home and nobody is currently with her during this visit. The provider, Loman Brooklyn, FNP is located in their home at time of visit.  I discussed the limitations, risks, security and privacy concerns of performing an evaluation and management service by telephone and the availability of in person appointments. I also discussed with the patient that there may be a patient responsible charge related to this service. The patient expressed understanding and agreed to proceed.  Subjective: PCP: Loman Brooklyn, FNP  Chief Complaint  Patient presents with  . Withdrawal  . Depression  . Anxiety   Patient has been wanting to get off of methadone for a while now.  She and I had discussed this back in December, at which time her plan was to discuss with her counselor and start the weaning process after she returned from [place] for the holidays.  At the end of this month marks 2 years of sobriety for her and April marks 2 years that she has been on methadone.  She is ready to take the next step and start her weaning off of methadone.  She has been discussing this with her counselor and doctor and reports she was given 2 options.  She could either decrease by 10 mg/week or 10 mg/day over 6 days.  She has chosen to decrease over 6 days due to the cost of the methadone.  She pays $98 per week for her methadone.  Her counselor and physician that prescribes methadone will help with the weaning process.  She will see them every day while she is weaning.  However, they are unable to prescribe any medications other than the methadone.  They have recommended that she speak with me regarding the possibility of prescriptions for Phenergan and clonidine to help with the withdrawal  symptoms.  Patient is no longer established with [facility].  They had wanted her to get in within 30 days to do an assessment and get a new counselor.  She was unable to do this in the timeframe they requested due to work and her trip to [place] for the holidays.  At her last visit on 06/01/2019 we added Wellbutrin 150 mg once daily.  She was continued on Celexa 40 mg once daily and hydroxyzine 25 mg 3 times daily as needed.  Today she reports she feels her depression is doing better.  She is actually getting up and cleaning her apartment and showering on a regular basis.  She does feel she still has trouble finding the energy to do things but she is trying.  She definitely feels the Wellbutrin has helped and is interested in a dosage increase.  Patient does have some PTSD and reports that she does sometimes think about things that have happened to her in the past.  She tries really hard to work through these herself and knows that she needs counseling.   Depression screen [facility] 2/9 07/23/2019 06/01/2019 01/23/2019  Decreased Interest 1 1 3   Down, Depressed, Hopeless 1 2 1   PHQ - 2 Score 2 3 4   Altered sleeping 3 3 2   Tired, decreased energy 2 3 3   Change in appetite 3 3 1   Feeling bad or failure about yourself  0 1 0  Trouble concentrating 1 3 2   Moving  slowly or fidgety/restless 0 2 2  Suicidal thoughts 0 0 0  PHQ-9 Score 11 18 14   Difficult doing work/chores Somewhat difficult Very difficult Extremely dIfficult   GAD 7 : Generalized Anxiety Score 07/23/2019  Nervous, Anxious, on Edge 2  Control/stop worrying 3  Worry too much - different things 3  Trouble relaxing 2  Restless 0  Easily annoyed or irritable 1  Afraid - awful might happen 2  Total GAD 7 Score 13  Anxiety Difficulty Somewhat difficult   Elaine Lopez mentions she ran out of gabapentin for a few days when she was trying to get a refill and that was when she really realized that the gabapentin is definitely helping with the pain she  experiences due to endometriosis.   ROS: Per HPI  Current Outpatient Medications:  .  buPROPion (WELLBUTRIN XL) 300 MG 24 hr tablet, Take 1 tablet (300 mg total) by mouth daily., Disp: 30 tablet, Rfl: 2 .  citalopram (CELEXA) 40 MG tablet, Take 1 tablet (40 mg total) by mouth daily., Disp: 90 tablet, Rfl: 1 .  Cyanocobalamin (VITAMIN B-12 PO), Take by mouth daily., Disp: , Rfl:  .  doxepin (SINEQUAN) 10 MG capsule, Take 1 capsule (10 mg total) by mouth at bedtime., Disp: 30 capsule, Rfl: 2 .  gabapentin (NEURONTIN) 300 MG capsule, Take 2 capsules (600 mg total) by mouth at bedtime., Disp: 180 capsule, Rfl: 1 .  hydrOXYzine (ATARAX/VISTARIL) 25 MG tablet, Take 1 tablet (25 mg total) by mouth 3 (three) times daily as needed. Needs OV, Disp: 270 tablet, Rfl: 1 .  Melatonin 10 MG CAPS, Take 1 capsule by mouth at bedtime., Disp: , Rfl:  .  methadone (DOLOPHINE) 10 MG/ML solution, Take 7.5 mLs by mouth daily., Disp: , Rfl:  .  methocarbamol (ROBAXIN-750) 750 MG tablet, Take 1 tablet (750 mg total) by mouth every 6 (six) hours as needed for muscle spasms., Disp: 30 tablet, Rfl: 1 .  Multiple Vitamin (MULTIVITAMIN WITH MINERALS) TABS tablet, Take 1 tablet by mouth daily., Disp: , Rfl:  .  promethazine (PHENERGAN) 25 MG tablet, Take 1 tablet (25 mg total) by mouth every 4 (four) hours as needed for nausea or vomiting., Disp: 45 tablet, Rfl: 0 .  VITAMIN D, CHOLECALCIFEROL, PO, Take 1,000 Units by mouth daily., Disp: , Rfl:   No Known Allergies Past Medical History:  Diagnosis Date  . Abnormal TSH 01/26/2019  . Allergy   . Chronic depression   . Endometriosis    stage 4   . History of drug abuse (HCC)   . Lumbar herniated disc    x3  . PTSD (post-traumatic stress disorder)   . Vitamin D insufficiency 01/26/2019    Observations/Objective: A&O  No respiratory distress or wheezing audible over the phone Mood, judgement, and thought processes all WNL  Assessment and Plan: 1. Methadone  withdrawal (HCC) - Patient is going to start weaning off of methadone next Tuesday.  She will be seen by her counselor and physician at the methadone clinic every day during this wean.  They are unable to provide medications to help with withdrawal symptoms and have requested that I help, which I am okay with.  Patient will continue on treatment for anxiety. I offered Bentyl for possible abdominal cramping; she will discuss this one with her physician. Advised on Imodium for diarrhea. Phenergan prescribed for nausea and vomiting. Doxepin for insomnia and anxiety. Ibuprofen/Tylenol for muscle aches, joint pain, and headaches. Robaxin for muscle spasms and restless legs.  We will use clonidine as well but patient is going to discuss with her physician to see if she has recommendations on dosage and frequency. If not, we will try 0.1 mg 3-4 times a day.  - promethazine (PHENERGAN) 25 MG tablet; Take 1 tablet (25 mg total) by mouth every 4 (four) hours as needed for nausea or vomiting.  Dispense: 45 tablet; Refill: 0 - doxepin (SINEQUAN) 10 MG capsule; Take 1 capsule (10 mg total) by mouth at bedtime.  Dispense: 30 capsule; Refill: 2 - hydrOXYzine (ATARAX/VISTARIL) 25 MG tablet; Take 1 tablet (25 mg total) by mouth 3 (three) times daily as needed. Needs OV  Dispense: 270 tablet; Refill: 1 - methocarbamol (ROBAXIN-750) 750 MG tablet; Take 1 tablet (750 mg total) by mouth every 6 (six) hours as needed for muscle spasms.  Dispense: 30 tablet; Refill: 1  2. Chronic depression - Improving. Increasing Wellbutrin from 150 mg to 300 mg once daily.  - doxepin (SINEQUAN) 10 MG capsule; Take 1 capsule (10 mg total) by mouth at bedtime.  Dispense: 30 capsule; Refill: 2 - citalopram (CELEXA) 40 MG tablet; Take 1 tablet (40 mg total) by mouth daily.  Dispense: 90 tablet; Refill: 1 - buPROPion (WELLBUTRIN XL) 300 MG 24 hr tablet; Take 1 tablet (300 mg total) by mouth daily.  Dispense: 30 tablet; Refill: 2 - hydrOXYzine  (ATARAX/VISTARIL) 25 MG tablet; Take 1 tablet (25 mg total) by mouth 3 (three) times daily as needed. Needs OV  Dispense: 270 tablet; Refill: 1  3. Generalized anxiety disorder - Worse right now due to anxiety about weaning and finances.  - doxepin (SINEQUAN) 10 MG capsule; Take 1 capsule (10 mg total) by mouth at bedtime.  Dispense: 30 capsule; Refill: 2 - citalopram (CELEXA) 40 MG tablet; Take 1 tablet (40 mg total) by mouth daily.  Dispense: 90 tablet; Refill: 1 - buPROPion (WELLBUTRIN XL) 300 MG 24 hr tablet; Take 1 tablet (300 mg total) by mouth daily.  Dispense: 30 tablet; Refill: 2 - hydrOXYzine (ATARAX/VISTARIL) 25 MG tablet; Take 1 tablet (25 mg total) by mouth 3 (three) times daily as needed. Needs OV  Dispense: 270 tablet; Refill: 1  4. PTSD (post-traumatic stress disorder) - Patient will start counseling once she is more stable.  - doxepin (SINEQUAN) 10 MG capsule; Take 1 capsule (10 mg total) by mouth at bedtime.  Dispense: 30 capsule; Refill: 2 - citalopram (CELEXA) 40 MG tablet; Take 1 tablet (40 mg total) by mouth daily.  Dispense: 90 tablet; Refill: 1 - buPROPion (WELLBUTRIN XL) 300 MG 24 hr tablet; Take 1 tablet (300 mg total) by mouth daily.  Dispense: 30 tablet; Refill: 2 - hydrOXYzine (ATARAX/VISTARIL) 25 MG tablet; Take 1 tablet (25 mg total) by mouth 3 (three) times daily as needed. Needs OV  Dispense: 270 tablet; Refill: 1  5. Endometriosis - Doing better with gabapentin. - gabapentin (NEURONTIN) 300 MG capsule; Take 2 capsules (600 mg total) by mouth at bedtime.  Dispense: 180 capsule; Refill: 1    Follow Up Instructions:  I discussed the assessment and treatment plan with the patient. The patient was provided an opportunity to ask questions and all were answered. The patient agreed with the plan and demonstrated an understanding of the instructions.   The patient was advised to call back or seek an in-person evaluation if the symptoms worsen or if the condition  fails to improve as anticipated.  The above assessment and management plan was discussed with the patient. The patient verbalized understanding  of and has agreed to the management plan. Patient is aware to call the clinic if symptoms persist or worsen. Patient is aware when to return to the clinic for a follow-up visit. Patient educated on when it is appropriate to go to the emergency department.   Time call ended: 2:12 PM  I provided 50 minutes of non-face-to-face time during this encounter.  Deliah Boston, MSN, APRN, FNP-C [facility] 07/23/19

## 2019-07-27 MED ORDER — CLONIDINE HCL 0.1 MG PO TABS: 0.1000 mg | ORAL_TABLET | Freq: Two times a day (BID) | ORAL | 0 refills | Status: DC

## 2019-07-27 MED ORDER — DICYCLOMINE HCL 10 MG PO CAPS: 10.0000 mg | ORAL_CAPSULE | Freq: Four times a day (QID) | ORAL | 0 refills | Status: DC | PRN

## 2019-07-27 NOTE — Telephone Encounter (Signed)
I have sent the prescriptions. Please let her know when she comes off the clonidine, I want her to decrease down to 0.1 mg once daily before stopping it all together. If she just stops at the 0.1 mg BID she may experience rebound hypertension. Please also tell her I said she's got this and I am just a call or MyChart message away if she needs something!

## 2019-07-27 NOTE — Telephone Encounter (Signed)
Mail box full can not leave message  °

## 2019-07-27 NOTE — Telephone Encounter (Signed)
Pt called to let Britney know that she spoke with her Clinic Doctor and was told that Bentyl Rx was ok for pt to take and Clonidine .1mg  2x/day was also ok. Wants to be called so that she knows whether these will be sent to pharmacy or not.

## 2019-07-27 NOTE — Telephone Encounter (Signed)
Attempted to contact patient, no answer, no voicemail.

## 2019-07-28 NOTE — Telephone Encounter (Signed)
Patient aware and voices understanding

## 2019-08-18 ENCOUNTER — Telehealth: Payer: Self-pay | Admitting: Family Medicine

## 2019-08-18 DIAGNOSIS — F4310 Post-traumatic stress disorder, unspecified: Secondary | ICD-10-CM

## 2019-08-18 DIAGNOSIS — F1193 Opioid use, unspecified with withdrawal: Secondary | ICD-10-CM

## 2019-08-18 DIAGNOSIS — F411 Generalized anxiety disorder: Secondary | ICD-10-CM

## 2019-08-18 DIAGNOSIS — F1123 Opioid dependence with withdrawal: Secondary | ICD-10-CM

## 2019-08-18 DIAGNOSIS — F32A Depression, unspecified: Secondary | ICD-10-CM

## 2019-08-18 DIAGNOSIS — F329 Major depressive disorder, single episode, unspecified: Secondary | ICD-10-CM

## 2019-08-18 MED ORDER — HYDROXYZINE HCL 25 MG PO TABS: 25.0000 mg | ORAL_TABLET | Freq: Three times a day (TID) | ORAL | 1 refills | Status: DC | PRN

## 2019-08-18 NOTE — Telephone Encounter (Signed)
  Medication Request  08/18/2019  What is the name of the medication? hydrOXYzine (ATARAX/VISTARIL) 25 MG tablet    Have you contacted your pharmacy to request a refill? yes  Which pharmacy would you like this sent to? [facility]   Patient notified that their request is being sent to the clinical staff for review and that they should receive a call once it is complete. If they do not receive a call within 24 hours they can check with their pharmacy or our office.    Pt wants to let Britney know that ever since coming off methadone she has been really weak and depression has been worse than it has ever been she had to quit her job. What can she recommend to help with this?

## 2020-01-27 ENCOUNTER — Telehealth: Payer: Self-pay | Admitting: Family Medicine

## 2020-01-27 DIAGNOSIS — N809 Endometriosis, unspecified: Secondary | ICD-10-CM

## 2020-01-27 DIAGNOSIS — F4310 Post-traumatic stress disorder, unspecified: Secondary | ICD-10-CM

## 2020-01-27 DIAGNOSIS — F411 Generalized anxiety disorder: Secondary | ICD-10-CM

## 2020-01-27 DIAGNOSIS — F1193 Opioid use, unspecified with withdrawal: Secondary | ICD-10-CM

## 2020-01-27 DIAGNOSIS — F32A Depression, unspecified: Secondary | ICD-10-CM

## 2020-01-27 MED ORDER — CITALOPRAM HYDROBROMIDE 40 MG PO TABS: 40.0000 mg | ORAL_TABLET | Freq: Every day | ORAL | 0 refills | Status: DC

## 2020-01-27 MED ORDER — GABAPENTIN 300 MG PO CAPS: 600.0000 mg | ORAL_CAPSULE | Freq: Every day | ORAL | 0 refills | Status: DC

## 2020-01-27 MED ORDER — HYDROXYZINE HCL 25 MG PO TABS: 25.0000 mg | ORAL_TABLET | Freq: Three times a day (TID) | ORAL | 0 refills | Status: DC | PRN

## 2020-01-27 NOTE — Telephone Encounter (Signed)
NA / VM full. Refill sent to pharmacy

## 2020-01-27 NOTE — Telephone Encounter (Signed)
  Prescription Request  01/27/2020  What is the name of the medication or equipment? Gabapentin, Hydroxozine, Cetalipram  Have you contacted your pharmacy to request a refill? (if applicable) No  Which pharmacy would you like this sent to?  [facility]   Patient notified that their request is being sent to the clinical staff for review and that they should receive a response within 2 business days.   Joyce's pt.  Please call pt.

## 2020-02-19 ENCOUNTER — Other Ambulatory Visit: Payer: Self-pay | Admitting: Family Medicine

## 2020-02-25 ENCOUNTER — Ambulatory Visit: Payer: 59 | Admitting: Family Medicine

## 2020-02-25 ENCOUNTER — Other Ambulatory Visit: Payer: Self-pay

## 2020-02-25 ENCOUNTER — Encounter: Payer: Self-pay | Admitting: Family Medicine

## 2020-02-25 VITALS — BP 124/79 | HR 90 | Temp 98.4°F | Ht 70.0 in | Wt 243.4 lb

## 2020-02-25 DIAGNOSIS — F1911 Other psychoactive substance abuse, in remission: Secondary | ICD-10-CM

## 2020-02-25 DIAGNOSIS — F329 Major depressive disorder, single episode, unspecified: Secondary | ICD-10-CM

## 2020-02-25 DIAGNOSIS — F411 Generalized anxiety disorder: Secondary | ICD-10-CM | POA: Diagnosis not present

## 2020-02-25 DIAGNOSIS — E559 Vitamin D deficiency, unspecified: Secondary | ICD-10-CM

## 2020-02-25 DIAGNOSIS — R7989 Other specified abnormal findings of blood chemistry: Secondary | ICD-10-CM

## 2020-02-25 DIAGNOSIS — F4310 Post-traumatic stress disorder, unspecified: Secondary | ICD-10-CM | POA: Diagnosis not present

## 2020-02-25 DIAGNOSIS — R5383 Other fatigue: Secondary | ICD-10-CM

## 2020-02-25 DIAGNOSIS — N809 Endometriosis, unspecified: Secondary | ICD-10-CM

## 2020-02-25 DIAGNOSIS — E669 Obesity, unspecified: Secondary | ICD-10-CM

## 2020-02-25 DIAGNOSIS — F32A Depression, unspecified: Secondary | ICD-10-CM

## 2020-02-25 MED ORDER — BUSPIRONE HCL 7.5 MG PO TABS: 7.5000 mg | ORAL_TABLET | Freq: Two times a day (BID) | ORAL | 2 refills | Status: DC

## 2020-02-25 NOTE — Progress Notes (Signed)
Assessment & Plan:  1. Generalized anxiety disorder - Uncontrolled. Adding buspirone. Failed doxepin. Continue Celexa and hydroxyzine.  - CMP14+EGFR - busPIRone (BUSPAR) 7.5 MG tablet; Take 1 tablet (7.5 mg total) by mouth 2 (two) times daily.  Dispense: 60 tablet; Refill: 2  2. Chronic depression - Well controlled on current regimen.  - Anemia Profile B - CMP14+EGFR - Thyroid Panel With TSH  3. PTSD (post-traumatic stress disorder) - Well controlled on current regimen.  - CMP14+EGFR  4. History of drug abuse ([place]) - Doing well. No drugs since 2019. Off methadone since earlier this year.   5. Endometriosis - Well controlled on current regimen.   6. Obesity (BMI 30.0-34.9) - Continue diet and exercise.  - Anemia Profile B - CMP14+EGFR - Thyroid Panel With TSH - Lipid panel  7. Vitamin D insufficiency - Labs to assess.  - VITAMIN D 25 Hydroxy (Vit-D Deficiency, Fractures)  8. Abnormal TSH - Thyroid Panel With TSH - Lipid panel  9. Tired - Anemia Profile B - CMP14+EGFR - Thyroid Panel With TSH   Return in about 6 weeks (around 04/07/2020) for anxiety (may do telephone if patient wants).  Hendricks Limes, MSN, APRN, FNP-C [facility]  Subjective:    [name], female    DOB: 1990/09/25, 29 y.o.   MRN: [id number]  Patient Care Team: Loman Brooklyn, FNP as PCP - General (Family Medicine) Dr. Hoyle Barr as Consulting Physician (Psychiatry) Hendricks as Consulting Physician   Chief Complaint:  Chief Complaint  Patient presents with   Anxiety    check up of chronic medical conditions. Patient feels that the hydroxyxine is not working like it used to.   Labs Only    Patient would like lab work.    HPI: Elaine Lopez is a 29 y.o. female presenting on 02/25/2020 for Anxiety (check up of chronic medical conditions. Patient feels that the hydroxyxine is not working like it used to.) and Labs Only (Patient would like  lab work.)  Patient is here for a follow-up of anxiety and depression.  Since our last visit she has stopped taking Wellbutrin and doxepin.  She states the doxepin was not helping and she feels better off the Wellbutrin than she did when she was on it.  She does not feel like the hydroxyzine works like it used to.  She is still taking the Celexa 40 mg once daily.  Depression screen [facility] 2/9 02/25/2020 07/23/2019 06/01/2019  Decreased Interest _0 Down, Depressed, Hopeless 0 1 2  PHQ - 2 Score _1 Altered sleeping _2 Tired, decreased energy _3 Change in appetite 0 3 3  Feeling bad or failure about yourself  0 0 1  Trouble concentrating _4 Moving slowly or fidgety/restless 0 0 2  Suicidal thoughts 0 0 0  PHQ-9 Score _5 Difficult doing work/chores - Somewhat difficult Very difficult   GAD 7 : Generalized Anxiety Score 02/25/2020 07/23/2019  Nervous, Anxious, on Edge 3 2  Control/stop worrying 3 3  Worry too much - different things 1 3  Trouble relaxing 3 2  Restless 1 0  Easily annoyed or irritable 2 1  Afraid - awful might happen 2 2  Total GAD 7 Score 15 13  Anxiety Difficulty - Somewhat difficult    Patient has successfully weaned off of the methadone.  She reports the withdrawal side effects were  horrible even with all the medication she had to help her, but she did it.  She is now working as a Web designer.  New complaints: Patient would like lab work due to her inability to lose weight, feeling tired and like she has no energy.  She has been drinking protein drinks, eating healthy, and walking daily.   Social history:  Relevant past medical, surgical, family and social history reviewed and updated as indicated. Interim medical history since our last visit reviewed.  Allergies and medications reviewed and updated.  DATA REVIEWED: CHART IN EPIC  ROS: Negative unless specifically indicated above in HPI.    Current Outpatient Medications:    citalopram  (CELEXA) 40 MG tablet, Take 1 tablet (40 mg total) by mouth daily., Disp: 90 tablet, Rfl: 0   Cyanocobalamin (VITAMIN B-12 PO), Take by mouth daily., Disp: , Rfl:    gabapentin (NEURONTIN) 300 MG capsule, Take 2 capsules (600 mg total) by mouth at bedtime., Disp: 180 capsule, Rfl: 0   hydrOXYzine (ATARAX/VISTARIL) 25 MG tablet, Take 1 tablet (25 mg total) by mouth 3 (three) times daily as needed. Needs OV, Disp: 270 tablet, Rfl: 0   Melatonin 10 MG CAPS, Take 1 capsule by mouth at bedtime., Disp: , Rfl:    Multiple Vitamin (MULTIVITAMIN WITH MINERALS) TABS tablet, Take 1 tablet by mouth daily., Disp: , Rfl:    VITAMIN D, CHOLECALCIFEROL, PO, Take 1,000 Units by mouth daily., Disp: , Rfl:    No Known Allergies Past Medical History:  Diagnosis Date   Abnormal TSH 01/26/2019   Allergy    Chronic depression    Endometriosis    stage 4    History of drug abuse (HCC)    Lumbar herniated disc    x3   PTSD (post-traumatic stress disorder)    Vitamin D insufficiency 01/26/2019    Past Surgical History:  Procedure Laterality Date   laproscopy     x3   LUMBAR EPIDURAL INJECTION     TONSILLECTOMY AND ADENOIDECTOMY      Social History   Socioeconomic History   Marital status: Single    Spouse name: Not on file   Number of children: 0   Years of education: Not on file   Highest education level: Not on file  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   Sexual activity: Not on file  Other Topics Concern   Not on file  Social History Narrative   Not on file   Social Determinants of Health   Financial Resource  Strain:    Difficulty of Paying Living Expenses: Not on file  Food Insecurity:    Worried About [place] in the Last Year: Not on file   Ran Out of Food in the Last Year: Not on file  Transportation Needs:    Lack of Transportation (Medical): Not on file   Lack of Transportation (Non-Medical): Not on file  Physical Activity:    Days of Exercise per Week: Not on file   Minutes of Exercise per Session: Not on file  Stress:    Feeling of Stress : Not on file  Social Connections:    Frequency of Communication with Friends and Family: Not on file   Frequency of Social Gatherings with Friends and Family: Not on file   Attends Religious Services: Not on file   Active Member of Clubs or Organizations: Not on file   Attends Archivist Meetings: Not on file   Marital Status: Not on file  Intimate Partner Violence:    Fear of Current or Ex-Partner: Not on file   Emotionally Abused: Not on file   Physically Abused: Not on file   Sexually Abused: Not on file        Objective:    BP 124/79    Pulse 90    Temp 98.4 F (36.9 C) (Temporal)    Ht 5' 10" (1.778 m)    Wt 243 lb 6.4 oz (110.4 kg)    SpO2 97%    BMI 34.92 kg/m   Wt Readings from Last 3 Encounters:  02/25/20 243 lb 6.4 oz (110.4 kg)  01/23/19 246 lb (111.6 kg)    Physical Exam Vitals reviewed.  Constitutional:      General: She is not in acute distress.    Appearance: Normal appearance. She is obese. She is not ill-appearing, toxic-appearing or diaphoretic.  HENT:     Head: Normocephalic and atraumatic.  Eyes:     General: No scleral icterus.       Right eye: No discharge.        Left eye: No discharge.     Conjunctiva/sclera: Conjunctivae normal.  Cardiovascular:     Rate and Rhythm: Normal rate and regular rhythm.     Heart sounds: Normal heart sounds. No murmur heard.  No friction rub. No gallop.   Pulmonary:     Effort: Pulmonary effort is normal. No respiratory distress.      Breath sounds: Normal breath sounds. No stridor. No wheezing, rhonchi or rales.  Musculoskeletal:        General: Normal range of motion.     Cervical back: Normal range of motion.  Skin:    General: Skin is warm and dry.     Capillary Refill: Capillary refill takes less than 2 seconds.  Neurological:     General: No focal deficit present.     Mental Status: She is alert and oriented to person, place, and time. Mental status is at baseline.  Psychiatric:        Mood and Affect: Mood normal.        Behavior: Behavior normal.        Thought Content: Thought content normal.        Judgment: Judgment normal.     Lab Results  Component Value Date   TSH 6.220 (H) 01/23/2019   Lab Results  Component Value Date   WBC 6.5 01/23/2019   HGB 12.7 01/23/2019   HCT 37.1 01/23/2019   MCV 83 01/23/2019   PLT 332 01/23/2019   Lab Results  Component Value Date   NA 137 01/23/2019   K 4.5 01/23/2019   CO2 22 01/23/2019   GLUCOSE 94 01/23/2019   BUN 11 01/23/2019   CREATININE 0.59 01/23/2019   BILITOT <0.2 01/23/2019   ALKPHOS 113 01/23/2019   AST 30 01/23/2019   ALT 26 01/23/2019   PROT 7.2 01/23/2019   ALBUMIN 4.5 01/23/2019   CALCIUM 9.7 01/23/2019   No results found for: CHOL No results found for: HDL No  results found for: [place] No results found for: TRIG No results found for: CHOLHDL No results found for: HGBA1C

## 2020-02-26 LAB — ANEMIA PROFILE B
Basophils Absolute: 0 10*3/uL (ref 0.0–0.2)
Basos: 0 %
EOS (ABSOLUTE): 0.1 10*3/uL (ref 0.0–0.4)
Eos: 2 %
Ferritin: 63 ng/mL (ref 15–150)
Folate: >20 ng/mL (ref >3.0)
Hematocrit: 38.3 % (ref 34.0–46.6)
Hemoglobin: 12.7 g/dL (ref 11.1–15.9)
Immature Grans (Abs): 0 10*3/uL (ref 0.0–0.1)
Immature Granulocytes: 1 %
Iron Saturation: 23 % (ref 15–55)
Iron: 62 ug/dL (ref 27–159)
Lymphocytes Absolute: 2.7 10*3/uL (ref 0.7–3.1)
Lymphs: 30 %
MCH: 28.3 pg (ref 26.6–33.0)
MCHC: 33.2 g/dL (ref 31.5–35.7)
MCV: 86 fL (ref 79–97)
Monocytes Absolute: 0.8 10*3/uL (ref 0.1–0.9)
Monocytes: 9 %
Neutrophils Absolute: 5.2 10*3/uL (ref 1.4–7.0)
Neutrophils: 58 %
Platelets: 374 10*3/uL (ref 150–450)
RBC: 4.48 x10E6/uL (ref 3.77–5.28)
RDW: 12.4 % (ref 11.7–15.4)
Retic Ct Pct: 1.7 % (ref 0.6–2.6)
Total Iron Binding Capacity: 273 ug/dL (ref 250–450)
UIBC: 211 ug/dL (ref 131–425)
Vitamin B-12: >2000 pg/mL — ABNORMAL HIGH (ref 232–1245)
WBC: 8.8 10*3/uL (ref 3.4–10.8)

## 2020-02-26 LAB — THYROID PANEL WITH TSH
Free Thyroxine Index: 2.6 (ref 1.2–4.9)
T3 Uptake Ratio: 29 % (ref 24–39)
T4, Total: 8.8 ug/dL (ref 4.5–12.0)
TSH: 0.006 u[IU]/mL — ABNORMAL LOW (ref 0.450–4.500)

## 2020-02-26 LAB — CMP14+EGFR
ALT: 32 IU/L (ref 0–32)
AST: 22 IU/L (ref 0–40)
Albumin/Globulin Ratio: 1.4 (ref 1.2–2.2)
Albumin: 4.2 g/dL (ref 3.9–5.0)
Alkaline Phosphatase: 132 IU/L — ABNORMAL HIGH (ref 48–121)
BUN/Creatinine Ratio: 15 (ref 9–23)
BUN: 8 mg/dL (ref 6–20)
Bilirubin Total: 0.3 mg/dL (ref 0.0–1.2)
CO2: 24 mmol/L (ref 20–29)
Calcium: 9.7 mg/dL (ref 8.7–10.2)
Chloride: 105 mmol/L (ref 96–106)
Creatinine, Ser: 0.54 mg/dL — ABNORMAL LOW (ref 0.57–1.00)
GFR calc Af Amer: 150 mL/min/{1.73_m2} (ref >59)
GFR calc non Af Amer: 130 mL/min/{1.73_m2} (ref >59)
Globulin, Total: 2.9 g/dL (ref 1.5–4.5)
Glucose: 93 mg/dL (ref 65–99)
Potassium: 4.6 mmol/L (ref 3.5–5.2)
Sodium: 141 mmol/L (ref 134–144)
Total Protein: 7.1 g/dL (ref 6.0–8.5)

## 2020-02-26 LAB — LIPID PANEL
Chol/HDL Ratio: 4.6 ratio — ABNORMAL HIGH (ref 0.0–4.4)
Cholesterol, Total: 153 mg/dL (ref 100–199)
HDL: 33 mg/dL — ABNORMAL LOW (ref >39)
LDL Chol Calc (NIH): 93 mg/dL (ref 0–99)
Triglycerides: 154 mg/dL — ABNORMAL HIGH (ref 0–149)
VLDL Cholesterol Cal: 27 mg/dL (ref 5–40)

## 2020-02-26 LAB — VITAMIN D 25 HYDROXY (VIT D DEFICIENCY, FRACTURES): Vit D, 25-Hydroxy: 32.7 ng/mL (ref 30.0–100.0)

## 2020-02-28 ENCOUNTER — Encounter: Payer: Self-pay | Admitting: Family Medicine

## 2020-02-29 ENCOUNTER — Telehealth: Payer: Self-pay | Admitting: Family Medicine

## 2020-02-29 DIAGNOSIS — R7989 Other specified abnormal findings of blood chemistry: Secondary | ICD-10-CM

## 2020-03-01 NOTE — Telephone Encounter (Signed)
Referral placed.

## 2020-03-07 ENCOUNTER — Telehealth: Payer: Self-pay

## 2020-03-07 NOTE — Telephone Encounter (Signed)
Yes

## 2020-03-07 NOTE — Telephone Encounter (Signed)
Can this pt see whitney as a new pt?

## 2020-03-09 ENCOUNTER — Ambulatory Visit (INDEPENDENT_AMBULATORY_CARE_PROVIDER_SITE_OTHER): Payer: 59 | Admitting: Nurse Practitioner

## 2020-03-09 ENCOUNTER — Other Ambulatory Visit: Payer: Self-pay

## 2020-03-09 ENCOUNTER — Encounter: Payer: Self-pay | Admitting: Nurse Practitioner

## 2020-03-09 VITALS — BP 109/75 | HR 79 | Ht 70.0 in | Wt 244.0 lb

## 2020-03-09 DIAGNOSIS — R7989 Other specified abnormal findings of blood chemistry: Secondary | ICD-10-CM

## 2020-03-09 NOTE — Progress Notes (Signed)
03/09/2020     Endocrinology Consult Note    Subjective:    [name], female    DOB: Nov 25, 1990, PCP Gwenlyn Fudge, FNP.   Past Medical History:  Diagnosis Date  . Abnormal TSH 01/26/2019  . Allergy   . Chronic depression   . Endometriosis    stage 4   . History of drug abuse (HCC)   . Lumbar herniated disc    x3  . PTSD (post-traumatic stress disorder)   . Vitamin D insufficiency 01/26/2019    Past Surgical History:  Procedure Laterality Date  . laproscopy     x3  . LUMBAR EPIDURAL INJECTION    . TONSILLECTOMY AND ADENOIDECTOMY      Social History   Socioeconomic History  . Marital status: Single    Spouse name: Not on file  . Number of children: 0  . Years of education: Not on file  . 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 Smoking status: Current Some Day Smoker    Packs/day: 0.10  . Smokeless tobacco: Never Used  . Tobacco comment: maybe one a day   Vaping Use  . Vaping Use: Every day  Substance and Sexual Activity  . Alcohol use: Not Currently  . 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  Marland Kitchen Sexual activity: Not on file  Other Topics Concern  . Not on file  Social History Narrative  . Not on file   Social Determinants of Health   Financial Resource Strain:   . Difficulty of Paying Living Expenses: Not on file  Food Insecurity:   . Worried About Programme researcher, broadcasting/film/video in the Last Year: Not on file  . Ran Out of Food in the Last Year: Not on file  Transportation Needs:   . Lack of Transportation (Medical): Not on file  . Lack of Transportation (Non-Medical): Not on file  Physical Activity:   . Days of Exercise per Week: Not on file  . Minutes of Exercise per Session: Not on file  Stress:   . Feeling of Stress : Not on file  Social Connections:   . Frequency of Communication with Friends and Family:  Not on file  . Frequency of Social Gatherings with Friends and Family: Not on file  . Attends Religious Services: Not on file  . Active Member of Clubs or Organizations: Not on file  . Attends Banker Meetings: Not on file  . Marital Status: Not on file    Family History  Problem Relation Age of Onset  . Thyroid disease Mother   . Hypertension Mother   . Depression Mother   . Vascular Disease Mother   . Depression Father   . Cancer Sister        ewings sarcoma  . Heart attack Maternal Grandfather   . COPD Paternal Grandfather   . Heart disease Paternal Grandfather     Outpatient Encounter Medications as of 03/09/2020  Medication Sig  . busPIRone (BUSPAR) 7.5 MG tablet Take 1 tablet (7.5 mg total) by mouth 2 (two) times daily.  . citalopram (CELEXA) 40 MG tablet Take 1 tablet (40 mg total) by mouth daily.  . Cyanocobalamin (VITAMIN B-12 PO) Take by mouth daily.  Marland Kitchen gabapentin (NEURONTIN) 300 MG capsule Take 2 capsules (600 mg total) by mouth at bedtime.  Marland Kitchen  Melatonin 10 MG CAPS Take 2 capsules by mouth at bedtime.   . Multiple Vitamin (MULTIVITAMIN WITH MINERALS) TABS tablet Take 1 tablet by mouth daily.  Marland Kitchen VITAMIN D, CHOLECALCIFEROL, PO Take 1,000 Units by mouth daily.  . [DISCONTINUED] hydrOXYzine (ATARAX/VISTARIL) 25 MG tablet Take 1 tablet (25 mg total) by mouth 3 (three) times daily as needed. Needs OV   No facility-administered encounter medications on file as of 03/09/2020.    ALLERGIES: No Known Allergies  VACCINATION STATUS: Immunization History  Administered Date(s) Administered  . PFIZER SARS-COV-2 Vaccination 09/17/2019, 10/14/2019     HPI  [name] is 29 y.o. female who presents today with a medical history as above. she is being seen in consultation for hyperthyroidism requested by Gwenlyn Fudge, FNP.  she has been dealing with symptoms of fatigue, inability to lose weight, trouble sleeping, depression, anxiety, and palpitations. These  symptoms are progressively worsening and troubling to her. her most recent thyroid labs revealed suppressed TSH 0.006 on 02/25/20.  Last year her TSH was 6.220 on 01/23/19. she denies dysphagia, choking, shortness of breath, no recent voice change.    she has a family history of thyroid dysfunction in her mother (hypothyroidism), but denies family hx of thyroid cancer. she denies personal history of goiter. she is not on any anti-thyroid medications nor on any thyroid hormone supplements. she  is willing to proceed with appropriate work up and therapy for thyrotoxicosis.  -She has been taking a once daily womens vitamin that has biotin in it.                           Review of systems  Constitutional:  weight gain, + fatigue, no subjective hyperthermia Eyes: no blurry vision, no xerophthalmia ENT: no sore throat, no nodules palpated in throat, no dysphagia/odynophagia, nor hoarseness Cardiovascular: no Chest Pain, no Shortness of Breath, + palpitations (worse at night), no leg swelling Respiratory: no cough, no SOB Gastrointestinal: no Nausea, no Vomiting, no Diarrhea Musculoskeletal: no muscle/joint aches Skin: no rashes Neurological: no tremors, no numbness, no tingling, no dizziness Psychiatric:  + depression, + anxiety, insomnia   Objective:    BP 109/75 (BP Location: Left Arm, Patient Position: Sitting)   Pulse 79   Ht 5\' 10"  (1.778 m)   Wt 244 lb (110.7 kg)   BMI 35.01 kg/m   Wt Readings from Last 3 Encounters:  03/09/20 244 lb (110.7 kg)  02/25/20 243 lb 6.4 oz (110.4 kg)  01/23/19 246 lb (111.6 kg)       BP Readings from Last 3 Encounters:  03/09/20 109/75  02/25/20 124/79  01/23/19 100/72                      Physical Exam- Limited  Constitutional:  Body mass index is 35.01 kg/m. , not in acute distress, normal state of mind Eyes:  EOMI, no exophthalmos Neck: Supple Thyroid: No gross goiter Cardiovascular: RRR, no murmers, rubs, or gallops, no  edema Respiratory: Adequate breathing efforts, no crackles, rales, rhonchi, or wheezing Musculoskeletal: no gross deformities, strength intact in all four extremities, no gross restriction of joint movements Skin:  no rashes, no hyperemia Neurological: no tremor with outstretched hands, deep tendon reflexes in BLE normal   CMP     Component Value Date/Time   NA 141 02/25/2020 1624   K 4.6 02/25/2020 1624   CL 105 02/25/2020 1624   CO2 24 02/25/2020 1624  GLUCOSE 93 02/25/2020 1624   BUN 8 02/25/2020 1624   CREATININE 0.54 (L) 02/25/2020 1624   CALCIUM 9.7 02/25/2020 1624   PROT 7.1 02/25/2020 1624   ALBUMIN 4.2 02/25/2020 1624   AST 22 02/25/2020 1624   ALT 32 02/25/2020 1624   ALKPHOS 132 (H) 02/25/2020 1624   BILITOT 0.3 02/25/2020 1624   GFRNONAA 130 02/25/2020 1624   GFRAA 150 02/25/2020 1624     CBC    Component Value Date/Time   WBC 8.8 02/25/2020 1624   RBC 4.48 02/25/2020 1624   HGB 12.7 02/25/2020 1624   HCT 38.3 02/25/2020 1624   PLT 374 02/25/2020 1624   MCV 86 02/25/2020 1624   MCH 28.3 02/25/2020 1624   MCHC 33.2 02/25/2020 1624   RDW 12.4 02/25/2020 1624   LYMPHSABS 2.7 02/25/2020 1624   EOSABS 0.1 02/25/2020 1624   BASOSABS 0.0 02/25/2020 1624     Diabetic Labs (most recent): No results found for: HGBA1C  Lipid Panel     Component Value Date/Time   CHOL 153 02/25/2020 1624   TRIG 154 (H) 02/25/2020 1624   HDL 33 (L) 02/25/2020 1624   CHOLHDL 4.6 (H) 02/25/2020 1624   LDLCALC 93 02/25/2020 1624   LABVLDL 27 02/25/2020 1624     Lab Results  Component Value Date   TSH 0.006 (L) 02/25/2020   TSH 6.220 (H) 01/23/2019        Assessment & Plan:   1.  Abnormal TSH: she is being seen at a kind request of Gwenlyn FudgeJoyce, Britney F, FNP.  her history and most recent labs are reviewed, and she was examined clinically.   Due to her continued intake of Biotin and the effects it has on thyroid function tests, etiology of her abnormal TSH is  unclear.  She has some symptoms of hypothyroidism and hyperthyroidism, and more testing is required to narrow down diagnosis.  I like to repeat full profile thyroid function tests in 2 weeks (after she has stopped taking her MVI with biotin in it).  I will also order a thyroid ultrasound to assess baseline anatomy of the thyroid.  The potential risks of untreated thyrotoxicosis and the need for definitive therapy have been discussed in detail with her, and she agrees to proceed with diagnostic workup and treatment plan. If it confirms hyperthyroidism, options of therapy are discussed with her of treating it with medications including methimazole or PTU which may have side effects including rash, transaminitis, and bone marrow suppression.  We also discussed the option of definitive therapy with RAI ablation of the thyroid.   If  she is found to have primary hyperthyroidism from Graves' disease , toxic multinodular goiter or toxic nodular goiter the preferred modality of treatment would be I-131 thyroid ablation.    -Patient is made aware of the high likelihood of post ablative hypothyroidism with subsequent need for lifelong thyroid hormone replacement. she understands this outcome and she is  willing to proceed.  Surgery may be another option.  she will return in 3 weeks for treatment decision.  I did not initiate any new prescriptions today.  -Patient is advised to maintain close follow up with Gwenlyn FudgeJoyce, Britney F, FNP for primary care needs.   - Time spent with the patient: 60 minutes, of which >50% was spent in obtaining information about her symptoms, reviewing her previous labs, evaluations, and treatments, counseling her about her suppressed TSH , and developing a plan to confirm the diagnosis and long term treatment as  necessary. Please refer to " Patient Self Inventory" in the Media  tab for reviewed elements of pertinent patient history.  Karna Christmas participated in the discussions,  expressed understanding, and voiced agreement with the above plans.  All questions were answered to her satisfaction. she is encouraged to contact clinic should she have any questions or concerns prior to her return visit.   Follow up plan: Return in about 3 weeks (around 03/30/2020) for Thyroid follow up, thyroid ultrasound, Previsit labs.   Thank you for involving me in the care of this pleasant patient, and I will continue to update you with her progress.   Ronny Bacon, [facility] [address] [place], [place] [address] Phone: [phone] Fax: [phone]  03/09/2020, 2:34 PM  This note was partially dictated with voice recognition software. Similar sounding words can be transcribed inadequately or may not  be corrected upon review.

## 2020-03-09 NOTE — Patient Instructions (Signed)
Hypothyroidism  Hypothyroidism is when the thyroid gland does not make enough of certain hormones (it is underactive). The thyroid gland is a small gland located in the lower front part of the neck, just in front of the windpipe (trachea). This gland makes hormones that help control how the body uses food for energy (metabolism) as well as how the heart and brain function. These hormones also play a role in keeping your bones strong. When the thyroid is underactive, it produces too little of the hormones thyroxine (T4) and triiodothyronine (T3). What are the causes? This condition may be caused by:  Hashimoto's disease. This is a disease in which the body's disease-fighting system (immune system) attacks the thyroid gland. This is the most common cause.  Viral infections.  Pregnancy.  Certain medicines.  Birth defects.  Past radiation treatments to the head or neck for cancer.  Past treatment with radioactive iodine.  Past exposure to radiation in the environment.  Past surgical removal of part or all of the thyroid.  Problems with a gland in the center of the brain (pituitary gland).  Lack of enough iodine in the diet. What increases the risk? You are more likely to develop this condition if:  You are female.  You have a family history of thyroid conditions.  You use a medicine called lithium.  You take medicines that affect the immune system (immunosuppressants). What are the signs or symptoms? Symptoms of this condition include:  Feeling as though you have no energy (lethargy).  Not being able to tolerate cold.  Weight gain that is not explained by a change in diet or exercise habits.  Lack of appetite.  Dry skin.  Coarse hair.  Menstrual irregularity.  Slowing of thought processes.  Constipation.  Sadness or depression. How is this diagnosed? This condition may be diagnosed based on:  Your symptoms, your medical history, and a physical exam.  Blood  tests. You may also have imaging tests, such as an ultrasound or MRI. How is this treated? This condition is treated with medicine that replaces the thyroid hormones that your body does not make. After you begin treatment, it may take several weeks for symptoms to go away. Follow these instructions at home:  Take over-the-counter and prescription medicines only as told by your health care provider.  If you start taking any new medicines, tell your health care provider.  Keep all follow-up visits as told by your health care provider. This is important. ? As your condition improves, your dosage of thyroid hormone medicine may change. ? You will need to have blood tests regularly so that your health care provider can monitor your condition. Contact a health care provider if:  Your symptoms do not get better with treatment.  You are taking thyroid replacement medicine and you: ? Sweat a lot. ? Have tremors. ? Feel anxious. ? Lose weight rapidly. ? Cannot tolerate heat. ? Have emotional swings. ? Have diarrhea. ? Feel weak. Get help right away if you have:  Chest pain.  An irregular heartbeat.  A rapid heartbeat.  Difficulty breathing. Summary  Hypothyroidism is when the thyroid gland does not make enough of certain hormones (it is underactive).  When the thyroid is underactive, it produces too little of the hormones thyroxine (T4) and triiodothyronine (T3).  The most common cause is Hashimoto's disease, a disease in which the body's disease-fighting system (immune system) attacks the thyroid gland. The condition can also be caused by viral infections, medicine, pregnancy, or past   radiation treatment to the head or neck.  Symptoms may include weight gain, dry skin, constipation, feeling as though you do not have energy, and not being able to tolerate cold.  This condition is treated with medicine to replace the thyroid hormones that your body does not make. This information  is not intended to replace advice given to you by your health care provider. Make sure you discuss any questions you have with your health care provider. Document Revised: 05/17/2017 Document Reviewed: 05/15/2017 Elsevier Patient Education  2020 [employer].   Hyperthyroidism  Hyperthyroidism is when the thyroid gland is too active (overactive). The thyroid gland is a small gland located in the lower front part of the neck, just in front of the windpipe (trachea). This gland makes hormones that help control how the body uses food for energy (metabolism) as well as how the heart and brain function. These hormones also play a role in keeping your bones strong. When the thyroid is overactive, it produces too much of a hormone called thyroxine. What are the causes? This condition may be caused by:  Graves' disease. This is a disorder in which the body's disease-fighting system (immune system) attacks the thyroid gland. This is the most common cause.  Inflammation of the thyroid gland.  A tumor in the thyroid gland.  Use of certain medicines, including: ? Prescription thyroid hormone replacement. ? Herbal supplements that mimic thyroid hormones. ? Amiodarone therapy.  Solid or fluid-filled lumps within your thyroid gland (thyroid nodules).  Taking in a large amount of iodine from foods or medicines. What increases the risk? You are more likely to develop this condition if:  You are female.  You have a family history of thyroid conditions.  You smoke tobacco.  You use a medicine called lithium.  You take medicines that affect the immune system (immunosuppressants). What are the signs or symptoms? Symptoms of this condition include:  Nervousness.  Inability to tolerate heat.  Unexplained weight loss.  Diarrhea.  Change in the texture of hair or skin.  Heart skipping beats or making extra beats.  Rapid heart rate.  Loss of menstruation.  Shaky  hands.  Fatigue.  Restlessness.  Sleep problems.  Enlarged thyroid gland or a lump in the thyroid (nodule). You may also have symptoms of Graves' disease, which may include:  Protruding eyes.  Dry eyes.  Red or swollen eyes.  Problems with vision. How is this diagnosed? This condition may be diagnosed based on:  Your symptoms and medical history.  A physical exam.  Blood tests.  Thyroid ultrasound. This test involves using sound waves to produce images of the thyroid gland.  A thyroid scan. A radioactive substance is injected into a vein, and images show how much iodine is present in the thyroid.  Radioactive iodine uptake test (RAIU). A small amount of radioactive iodine is given by mouth to see how much iodine the thyroid absorbs after a certain amount of time. How is this treated? Treatment depends on the cause and severity of the condition. Treatment may include:  Medicines to reduce the amount of thyroid hormone your body makes.  Radioactive iodine treatment (radioiodine therapy). This involves swallowing a small dose of radioactive iodine, in capsule or liquid form, to kill thyroid cells.  Surgery to remove part or all of your thyroid gland. You may need to take thyroid hormone replacement medicine for the rest of your life after thyroid surgery.  Medicines to help manage your symptoms. Follow these instructions at  home:   Take over-the-counter and prescription medicines only as told by your health care provider.  Do not use any products that contain nicotine or tobacco, such as cigarettes and e-cigarettes. If you need help quitting, ask your health care provider.  Follow any instructions from your health care provider about diet. You may be instructed to limit foods that contain iodine.  Keep all follow-up visits as told by your health care provider. This is important. ? You will need to have blood tests regularly so that your health care provider can  monitor your condition. Contact a health care provider if:  Your symptoms do not get better with treatment.  You have a fever.  You are taking thyroid hormone replacement medicine and you: ? Have symptoms of depression. ? Feel like you are tired all the time. ? Gain weight. Get help right away if:  You have chest pain.  You have decreased alertness or a change in your awareness.  You have abdominal pain.  You feel dizzy.  You have a rapid heartbeat.  You have an irregular heartbeat.  You have difficulty breathing. Summary  The thyroid gland is a small gland located in the lower front part of the neck, just in front of the windpipe (trachea).  Hyperthyroidism is when the thyroid gland is too active (overactive) and produces too much of a hormone called thyroxine.  The most common cause is Graves' disease, a disorder in which your immune system attacks the thyroid gland.  Hyperthyroidism can cause various symptoms, such as unexplained weight loss, nervousness, inability to tolerate heat, or changes in your heartbeat.  Treatment may include medicine to reduce the amount of thyroid hormone your body makes, radioiodine therapy, surgery, or medicines to manage symptoms. This information is not intended to replace advice given to you by your health care provider. Make sure you discuss any questions you have with your health care provider. Document Revised: 05/17/2017 Document Reviewed: 05/15/2017 Elsevier Patient Education  2020 [employer].

## 2020-03-16 ENCOUNTER — Other Ambulatory Visit: Payer: Self-pay

## 2020-03-16 ENCOUNTER — Ambulatory Visit (HOSPITAL_COMMUNITY)
Admission: RE | Admit: 2020-03-16 | Discharge: 2020-03-16 | Disposition: A | Discharge status: Home / Self Care | Payer: 59 | Source: Ambulatory Visit | Attending: Nurse Practitioner | Admitting: Nurse Practitioner

## 2020-03-16 DIAGNOSIS — R7989 Other specified abnormal findings of blood chemistry: Secondary | ICD-10-CM | POA: Insufficient documentation

## 2020-03-25 LAB — THYROID PEROXIDASE ANTIBODY: Thyroperoxidase Ab SerPl-aCnc: <8 IU/mL (ref 0–34)

## 2020-03-25 LAB — T4, FREE: Free T4: 0.96 ng/dL (ref 0.82–1.77)

## 2020-03-25 LAB — T3, FREE: T3, Free: 2.9 pg/mL (ref 2.0–4.4)

## 2020-03-25 LAB — THYROGLOBULIN ANTIBODY: Thyroglobulin Antibody: <1 IU/mL (ref 0.0–0.9)

## 2020-03-25 LAB — TSH: TSH: 0.178 u[IU]/mL — ABNORMAL LOW (ref 0.450–4.500)

## 2020-03-30 ENCOUNTER — Encounter: Payer: Self-pay | Admitting: Nurse Practitioner

## 2020-03-30 ENCOUNTER — Telehealth (INDEPENDENT_AMBULATORY_CARE_PROVIDER_SITE_OTHER): Payer: 59 | Admitting: Nurse Practitioner

## 2020-03-30 DIAGNOSIS — E0590 Thyrotoxicosis, unspecified without thyrotoxic crisis or storm: Secondary | ICD-10-CM | POA: Diagnosis not present

## 2020-03-30 NOTE — Progress Notes (Signed)
03/30/2020     Endocrinology Follow Up Visit  TELEHEALTH VISIT: The patient is being engaged in telehealth visit due to COVID-19.  This type of visit limits physical examination significantly, and thus is not preferable over face-to-face encounters.  I connected with  Elaine Lopez on 03/30/20 by a video enabled telemedicine application and verified that I am speaking with the correct person using two identifiers.   I discussed the limitations of evaluation and management by telemedicine. The patient expressed understanding and agreed to proceed.    The participants involved in this visit include: Dani Gobble, NP located at [facility] and Melanye Hiraldo  located at their personal residence listed.   Subjective:    [name], female    DOB: 11-05-1990, PCP Gwenlyn Fudge, FNP.   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   Sexual activity: Not on file   Other Topics Concern   Not on file  Social History Narrative   Not on file   Social Determinants of Health   Financial Resource Strain:    Difficulty of Paying Living Expenses: Not on file  Food Insecurity:    Worried About Running Out of Food in the Last Year: Not on file   Ran Out of Food in the Last Year: Not on file  Transportation Needs:    Lack of Transportation (Medical): Not on file   Lack of Transportation (Non-Medical): Not on file  Physical Activity:    Days of Exercise per Week: Not on file   Minutes of Exercise per Session: Not on file  Stress:    Feeling of Stress : Not on file  Social Connections:    Frequency of Communication with Friends and Family: Not on file   Frequency of Social Gatherings with Friends and Family: Not on file   Attends Religious Services: Not on file   Active Member of Clubs or Organizations: Not on file   Attends Banker Meetings: Not on file   Marital Status: Not on file    Family History  Problem Relation Age of Onset   Thyroid disease Mother    Hypertension Mother    Depression Mother    Vascular Disease Mother    Depression Father  Cancer Sister        ewings sarcoma   Heart attack Maternal Grandfather    COPD Paternal Grandfather    Heart disease Paternal Grandfather     Outpatient Encounter Medications as of 03/30/2020  Medication Sig   busPIRone (BUSPAR) 7.5 MG tablet Take 1 tablet (7.5 mg total) by mouth 2 (two) times daily.   citalopram (CELEXA) 40 MG tablet Take 1 tablet (40 mg total) by mouth daily.   Cyanocobalamin (VITAMIN B-12 PO) Take by mouth daily.   gabapentin (NEURONTIN) 300 MG capsule Take 2 capsules (600 mg total) by mouth at bedtime.   Melatonin 10 MG CAPS Take 2 capsules by mouth at bedtime.    Multiple Vitamin (MULTIVITAMIN WITH MINERALS) TABS tablet Take 1 tablet by mouth daily.   VITAMIN D, CHOLECALCIFEROL, PO Take 1,000 Units by mouth daily.   No  facility-administered encounter medications on file as of 03/30/2020.    ALLERGIES: No Known Allergies  VACCINATION STATUS: Immunization History  Administered Date(s) Administered   PFIZER SARS-COV-2 Vaccination 09/17/2019, 10/14/2019     HPI  Elaine Lopez is 29 y.o. female who presents today with a medical history as above. she is being seen in follow up after being seen in consultation for hyperthyroidism requested by Gwenlyn Fudge, FNP.  she has been dealing with symptoms of fatigue, inability to lose weight, trouble sleeping, depression, anxiety, and palpitations. These symptoms are progressively worsening and troubling to her.  she denies dysphagia, choking, shortness of breath, no recent voice change.    she has a family history of thyroid dysfunction in her mother (hypothyroidism), but denies family hx of thyroid cancer. she denies personal history of goiter. she is not on any anti-thyroid medications nor on any thyroid hormone supplements. she  is willing to proceed with appropriate work up and therapy for thyrotoxicosis.  -She has stopped taking the women's multivitamin with Biotin in it since last visit.    Review of systems  Constitutional:  weight gain, + fatigue, no subjective hyperthermia Eyes: no blurry vision, no xerophthalmia ENT: no sore throat, no nodules palpated in throat, no dysphagia/odynophagia, nor hoarseness Cardiovascular: no Chest Pain, no Shortness of Breath, + palpitations (worse at night), no leg swelling Respiratory: no cough, no SOB Gastrointestinal: no Nausea, no Vomiting, no Diarrhea Musculoskeletal: no muscle/joint aches Skin: no rashes Neurological: no tremors, no numbness, no tingling, no dizziness Psychiatric:  + depression, + anxiety, insomnia   Objective:    There were no vitals taken for this visit.  Wt Readings from Last 3 Encounters:  03/09/20 244 lb (110.7 kg)  02/25/20 243 lb 6.4 oz (110.4 kg)  01/23/19 246 lb (111.6 kg)        BP Readings from Last 3 Encounters:  03/09/20 109/75  02/25/20 124/79  01/23/19 100/72                      Physical Exam- Telehealth- significantly limited due to nature of visit  Constitutional: There is no height or weight on file to calculate BMI. , not in acute distress, normal state of mind Respiratory: Adequate breathing efforts   CMP     Component Value Date/Time   NA 141 02/25/2020 1624   K 4.6 02/25/2020 1624   CL 105 02/25/2020 1624   CO2 24 02/25/2020 1624   GLUCOSE 93 02/25/2020 1624   BUN 8 02/25/2020 1624   CREATININE 0.54 (L) 02/25/2020 1624   CALCIUM 9.7 02/25/2020 1624   PROT  7.1 02/25/2020 1624   ALBUMIN 4.2 02/25/2020 1624   AST 22 02/25/2020 1624   ALT 32 02/25/2020 1624   ALKPHOS 132 (H) 02/25/2020 1624   BILITOT 0.3 02/25/2020 1624   GFRNONAA 130 02/25/2020 1624   GFRAA 150 02/25/2020 1624     CBC    Component Value Date/Time   WBC 8.8 02/25/2020 1624   RBC 4.48 02/25/2020 1624   HGB 12.7 02/25/2020 1624   HCT 38.3 02/25/2020 1624   PLT 374 02/25/2020 1624   MCV 86 02/25/2020 1624   MCH 28.3 02/25/2020 1624   MCHC 33.2 02/25/2020 1624   RDW 12.4 02/25/2020 1624   LYMPHSABS 2.7 02/25/2020 1624   EOSABS 0.1 02/25/2020 1624   BASOSABS 0.0 02/25/2020 1624     Diabetic Labs (most recent): No results found for: HGBA1C  Lipid Panel     Component Value Date/Time   CHOL 153 02/25/2020 1624   TRIG 154 (H) 02/25/2020 1624   HDL 33 (L) 02/25/2020 1624   CHOLHDL 4.6 (H) 02/25/2020 1624   LDLCALC 93 02/25/2020 1624   LABVLDL 27 02/25/2020 1624     Lab Results  Component Value Date   TSH 0.178 (L) 03/24/2020   TSH 0.006 (L) 02/25/2020   TSH 6.220 (H) 01/23/2019   FREET4 0.96 03/24/2020     Results for Elaine ChristmasFLEMING, Monalisa (MRN [id number]) as of 03/30/2020 14:53  Ref. Range 01/23/2019 10:29 02/25/2020 16:24 03/16/2020 10:54 03/24/2020 16:03  TSH Latest Ref Range: 0.450 - 4.500 uIU/mL 6.220 (H) 0.006 (L)  0.178 (L)   Triiodothyronine,Free,Serum Latest Ref Range: 2.0 - 4.4 pg/mL    2.9  T4,Free(Direct) Latest Ref Range: 0.82 - 1.77 ng/dL    4.090.96  Thyroxine (T4) Latest Ref Range: 4.5 - 12.0 ug/dL  8.8    Free Thyroxine Index Latest Ref Range: 1.2 - 4.9   2.6    Thyroperoxidase Ab SerPl-aCnc Latest Ref Range: 0 - 34 IU/mL    <8  Thyroglobulin Antibody Latest Ref Range: 0.0 - 0.9 IU/mL    <1.0  T3 Uptake Ratio Latest Ref Range: 24 - 39 %  29    ---------------------------------------------------------------------------------------------------------------------------- US thyroid 03/16/20 CLINICAL DATA:  TSH suppression  EXAM: THYROID ULTRASOUND  TECHNIQUE: Ultrasound examination of the thyroid gland and adjacent soft tissues was performed.  COMPARISON:  None.  FINDINGS: Parenchymal Echotexture: Normal  Isthmus: 2 mm  Right lobe: 4.9 x 1.5 x 1.7 cm  Left lobe: 4.8 x 1.5 x 1.5 cm  _________________________________________________________  Estimated total number of nodules >/= 1 cm: 0  Number of spongiform nodules >/=  2 cm not described below (TR1): 0  Number of mixed cystic and solid nodules >/= 1.5 cm not described below (TR2): 0  _________________________________________________________  No discrete nodules are seen within the thyroid gland.  IMPRESSION: Normal thyroid ultrasound for age  The above is in keeping with the ACR TI-RADS recommendations - J Am Coll Radiol 2017;14:587-595.   Electronically Signed   By: Judie PetitM.  Shick M.D.   On: 03/16/2020 11:01 Assessment & Plan:   1.  Subclinical hyperthyroidism:  Her history and most recent labs are reviewed.  Based on her most recent labs with improving yet still suppressed TSH, she most likely has subclinical hyperthyroidism. Her thyroid ultrasound was unremarkable.  It does not appear to be caused by Graves disease as her TPO and thyroglobulin antibodies were undetectable.  She will need confirmation through  uptake and scan of her thyroid.  If uptake and scan is normal, patient  may benefit from low dose methimazole to correct her suppressed TSH numbers.  If it confirms hyperthyroidism, options of therapy are discussed with her of treating it with medications including methimazole or PTU which may have side effects including rash, transaminitis, and bone marrow suppression.  We also discussed the option of definitive therapy with RAI ablation of the thyroid.   If  she is found to have primary hyperthyroidism from Graves' disease , toxic multinodular goiter or toxic nodular goiter the preferred modality of treatment would be I-131 thyroid ablation.    -Patient is made aware of the high likelihood of post ablative hypothyroidism with subsequent need for lifelong thyroid hormone replacement. she understands this outcome and she is  willing to proceed.  Surgery may be another option.  I did not initiate any new prescriptions today.  -Patient is advised to maintain close follow up with Gwenlyn Fudge, FNP for primary care needs.    I spent 20 minutes dedicated to the care of this patient on the date of this encounter to include pre-visit review of records, face-to-face time with the patient, and post visit ordering of  testing. Elaine Lopez  participated in the discussions, expressed understanding, and voiced agreement with the above plans.  All questions were answered to her satisfaction. she is encouraged to contact clinic should she have any questions or concerns prior to her return visit.  Follow up plan: No follow-ups on file.   Thank you for involving me in the care of this pleasant patient, and I will continue to update you with her progress.   Ronny Bacon, [facility] [address] [place], [place] [address] Phone: [phone] Fax: [phone]  03/30/2020, 2:52 PM  This note was partially dictated with voice recognition software. Similar  sounding words can be transcribed inadequately or may not  be corrected upon review.

## 2020-04-06 ENCOUNTER — Encounter (HOSPITAL_COMMUNITY)
Admission: RE | Admit: 2020-04-06 | Discharge: 2020-04-06 | Disposition: A | Discharge status: Home / Self Care | Payer: 59 | Source: Ambulatory Visit | Attending: Nurse Practitioner | Admitting: Nurse Practitioner

## 2020-04-06 ENCOUNTER — Other Ambulatory Visit: Payer: Self-pay

## 2020-04-06 DIAGNOSIS — E0590 Thyrotoxicosis, unspecified without thyrotoxic crisis or storm: Secondary | ICD-10-CM | POA: Diagnosis present

## 2020-04-06 IMAGING — NM NM THYROID IMAGING W/ UPTAKE MULTI (4&24 HR)
4 series · 4 of 4 positions shown · non-contrast
Comparison: Thyroid ultrasound 03/16/2020

CLINICAL DATA: Hyperthyroidism; TSH

EXAM:
THYROID SCAN AND UPTAKE - 4 AND 24 HOURS
TECHNIQUE: Following oral administration of B-AEF capsule, anterior planar
imaging was acquired at 24 hours. Thyroid uptake was calculated with
a thyroid probe at 4-6 hours and 24 hours.
RADIOPHARMACEUTICALS:  414 uCi B-AEF sodium iodide p.o.

[Series 1: anterior · 1.18mm/px · 1 of 1 slices shown]
[im 1/1]
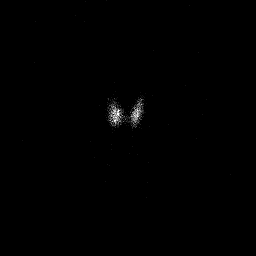

[Series 2: ant w marker · 1.18mm/px · 1 of 1 slices shown]
[im 1/1]
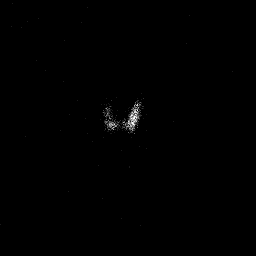

[Series 3: lao · 1.18mm/px · 1 of 1 slices shown]
[im 1/1]
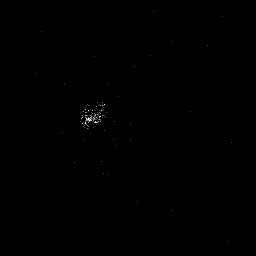

[Series 4: rao · 1.18mm/px · 1 of 1 slices shown]
[im 1/1]
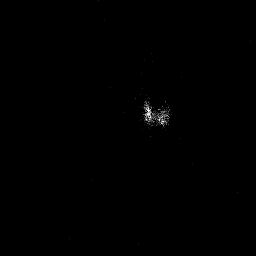

[4 of 4 positions shown; findings below may reference images not displayed]

FINDINGS: Homogeneous tracer distribution in both thyroid lobes.

No focal areas of increased or decreased tracer localization seen.

4 hour B-AEF uptake = 6% (normal 5-20%)

24 hour B-AEF uptake = 13% (normal 10-30%)
IMPRESSION: Normal thyroid scan.

Normal 4 hour and 24 hour radio iodine uptakes.

Please select correct template:

## 2020-04-06 MED ORDER — SODIUM IODIDE I-123 7.4 MBQ CAPS
414.0000 | ORAL_CAPSULE | Freq: Once | ORAL | Status: AC
  Administered 2020-04-06: 414 via ORAL

## 2020-04-07 ENCOUNTER — Encounter (HOSPITAL_COMMUNITY)
Admission: RE | Admit: 2020-04-07 | Discharge: 2020-04-07 | Disposition: A | Discharge status: Home / Self Care | Payer: 59 | Source: Ambulatory Visit | Attending: Nurse Practitioner | Admitting: Nurse Practitioner

## 2020-04-18 ENCOUNTER — Telehealth: Payer: Self-pay | Admitting: "Endocrinology

## 2020-04-18 NOTE — Telephone Encounter (Signed)
Tried to call pt and inform her it was okay. Was unable to reach pt

## 2020-04-18 NOTE — Telephone Encounter (Signed)
Yes, it can be virtual. 

## 2020-04-18 NOTE — Telephone Encounter (Signed)
Pt would like to know if her appt on 11/3 can be over the phone or if she needs to be in office.

## 2020-04-20 ENCOUNTER — Telehealth (INDEPENDENT_AMBULATORY_CARE_PROVIDER_SITE_OTHER): Payer: 59 | Admitting: Nurse Practitioner

## 2020-04-20 ENCOUNTER — Other Ambulatory Visit: Payer: Self-pay | Admitting: Family Medicine

## 2020-04-20 ENCOUNTER — Encounter: Payer: Self-pay | Admitting: Nurse Practitioner

## 2020-04-20 DIAGNOSIS — E0590 Thyrotoxicosis, unspecified without thyrotoxic crisis or storm: Secondary | ICD-10-CM | POA: Diagnosis not present

## 2020-04-20 DIAGNOSIS — N809 Endometriosis, unspecified: Secondary | ICD-10-CM

## 2020-04-20 MED ORDER — PROPRANOLOL HCL 20 MG PO TABS: 20.0000 mg | ORAL_TABLET | Freq: Two times a day (BID) | ORAL | 2 refills | Status: DC

## 2020-04-20 NOTE — Progress Notes (Signed)
04/20/2020     Endocrinology Follow Up Visit  TELEHEALTH VISIT: The patient is being engaged in telehealth visit due to COVID-19.  This type of visit limits physical examination significantly, and thus is not preferable over face-to-face encounters.  I connected with  Elaine Lopez on 04/20/20 by a video enabled telemedicine application and verified that I am speaking with the correct person using two identifiers.   I discussed the limitations of evaluation and management by telemedicine. The patient expressed understanding and agreed to proceed.    The participants involved in this visit include: Dani Gobble, NP located at [facility] and Elaine Lopez  located at their personal residence listed.   Subjective:    [name], female    DOB: [birth year], PCP Gwenlyn Fudge, FNP.   Past Medical History:  Diagnosis Date  . Abnormal TSH 01/26/2019  . Allergy   . Chronic depression   . Endometriosis    stage 4   . History of drug abuse (HCC)   . Lumbar herniated disc    x3  . PTSD (post-traumatic stress disorder)   . Vitamin D insufficiency 01/26/2019    Past Surgical History:  Procedure Laterality Date  . laproscopy     x3  . LUMBAR EPIDURAL INJECTION    . TONSILLECTOMY AND ADENOIDECTOMY      Social History   Socioeconomic History  . Marital status: Single    Spouse name: Not on file  . Number of children: 0  . Years of education: Not on file  . 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 Smoking status: Current Some Day Smoker    Packs/day: 0.10  . Smokeless tobacco: Never Used  . Tobacco comment: maybe one a day   Vaping Use  . Vaping Use: Every day  Substance and Sexual Activity  . Alcohol use: Not Currently  . 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  Marland Kitchen Sexual activity: Not on file   Other Topics Concern  . Not on file  Social History Narrative  . Not on file   Social Determinants of Health   Financial Resource Strain:   . Difficulty of Paying Living Expenses: Not on file  Food Insecurity:   . Worried About Programme researcher, broadcasting/film/video in the Last Year: Not on file  . Ran Out of Food in the Last Year: Not on file  Transportation Needs:   . Lack of Transportation (Medical): Not on file  . Lack of Transportation (Non-Medical): Not on file  Physical Activity:   . Days of Exercise per Week: Not on file  . Minutes of Exercise per Session: Not on file  Stress:   . Feeling of Stress : Not on file  Social Connections:   . Frequency of Communication with Friends and Family: Not on file  . Frequency of Social Gatherings with Friends and Family: Not on file  . Attends Religious Services: Not on file  . Active Member of Clubs or Organizations: Not on file  . Attends Banker Meetings: Not on file  . Marital Status: Not on file    Family History  Problem Relation Age of Onset  . Thyroid disease Mother   . Hypertension Mother   . Depression Mother   . Vascular Disease Mother   .  Depression Father   . Cancer Sister        ewings sarcoma  . Heart attack Maternal Grandfather   . COPD Paternal Grandfather   . Heart disease Paternal Grandfather     Outpatient Encounter Medications as of 04/20/2020  Medication Sig  . busPIRone (BUSPAR) 7.5 MG tablet Take 1 tablet (7.5 mg total) by mouth 2 (two) times daily.  . citalopram (CELEXA) 40 MG tablet Take 1 tablet (40 mg total) by mouth daily.  Marland Kitchen gabapentin (NEURONTIN) 300 MG capsule Take 2 capsules (600 mg total) by mouth at bedtime.  . Melatonin 10 MG CAPS Take 2 capsules by mouth at bedtime.   Marland Kitchen VITAMIN D, CHOLECALCIFEROL, PO Take 1,000 Units by mouth daily.  . propranolol (INDERAL) 20 MG tablet Take 1 tablet (20 mg total) by mouth 2 (two) times daily.  . [DISCONTINUED] Cyanocobalamin (VITAMIN B-12 PO) Take by mouth  daily. (Patient not taking: Reported on 03/30/2020)  . [DISCONTINUED] Multiple Vitamin (MULTIVITAMIN WITH MINERALS) TABS tablet Take 1 tablet by mouth daily. (Patient not taking: Reported on 03/30/2020)   No facility-administered encounter medications on file as of 04/20/2020.    ALLERGIES: No Known Allergies  VACCINATION STATUS: Immunization History  Administered Date(s) Administered  . PFIZER SARS-COV-2 Vaccination 09/17/2019, 10/14/2019     HPI  Elaine Lopez is 29 y.o. female who presents today with a medical history as above. she is being seen in follow up after being seen in consultation for hyperthyroidism requested by Gwenlyn Fudge, FNP.  she has been dealing with symptoms of fatigue, inability to lose weight, trouble sleeping, depression, anxiety, and palpitations. These symptoms are progressively worsening and troubling to her.  There has been no improvement in her symptoms since her initial consultation.  she denies dysphagia, choking, shortness of breath, no recent voice change.    she has a family history of thyroid dysfunction in her mother (hypothyroidism), but denies family hx of thyroid cancer. she denies personal history of goiter. she is not on any anti-thyroid medications nor on any thyroid hormone supplements. she  is willing to proceed with appropriate work up and therapy for thyrotoxicosis.  -She has stopped taking the women's multivitamin with Biotin in it since last visit.    Review of systems  Constitutional:  +weight gain, + fatigue, no subjective hyperthermia Eyes: no blurry vision, no xerophthalmia ENT: no sore throat, no nodules palpated in throat, no dysphagia/odynophagia, nor hoarseness Cardiovascular: no Chest Pain, no Shortness of Breath, + palpitations (worse at night), no leg swelling Respiratory: no cough, no SOB Gastrointestinal: no Nausea, no Vomiting, no Diarrhea Musculoskeletal: no muscle/joint aches Skin: no rashes Neurological: no  tremors, no numbness, no tingling, no dizziness Psychiatric:  + depression, + anxiety, insomnia   Objective:    There were no vitals taken for this visit.  Wt Readings from Last 3 Encounters:  03/09/20 244 lb (110.7 kg)  02/25/20 243 lb 6.4 oz (110.4 kg)  01/23/19 246 lb (111.6 kg)       BP Readings from Last 3 Encounters:  03/09/20 109/75  02/25/20 124/79  01/23/19 100/72                      Physical Exam- Telehealth- significantly limited due to nature of visit  Constitutional: There is no height or weight on file to calculate BMI. , not in acute distress, normal state of mind Respiratory: Adequate breathing efforts   CMP     Component Value Date/Time  NA 141 02/25/2020 1624   K 4.6 02/25/2020 1624   CL 105 02/25/2020 1624   CO2 24 02/25/2020 1624   GLUCOSE 93 02/25/2020 1624   BUN 8 02/25/2020 1624   CREATININE 0.54 (L) 02/25/2020 1624   CALCIUM 9.7 02/25/2020 1624   PROT 7.1 02/25/2020 1624   ALBUMIN 4.2 02/25/2020 1624   AST 22 02/25/2020 1624   ALT 32 02/25/2020 1624   ALKPHOS 132 (H) 02/25/2020 1624   BILITOT 0.3 02/25/2020 1624   GFRNONAA 130 02/25/2020 1624   GFRAA 150 02/25/2020 1624     CBC    Component Value Date/Time   WBC 8.8 02/25/2020 1624   RBC 4.48 02/25/2020 1624   HGB 12.7 02/25/2020 1624   HCT 38.3 02/25/2020 1624   PLT 374 02/25/2020 1624   MCV 86 02/25/2020 1624   MCH 28.3 02/25/2020 1624   MCHC 33.2 02/25/2020 1624   RDW 12.4 02/25/2020 1624   LYMPHSABS 2.7 02/25/2020 1624   EOSABS 0.1 02/25/2020 1624   BASOSABS 0.0 02/25/2020 1624     Diabetic Labs (most recent): No results found for: HGBA1C  Lipid Panel     Component Value Date/Time   CHOL 153 02/25/2020 1624   TRIG 154 (H) 02/25/2020 1624   HDL 33 (L) 02/25/2020 1624   CHOLHDL 4.6 (H) 02/25/2020 1624   LDLCALC 93 02/25/2020 1624   LABVLDL 27 02/25/2020 1624     Lab Results  Component Value Date   TSH 0.178 (L) 03/24/2020   TSH 0.006 (L) 02/25/2020   TSH  6.220 (H) 01/23/2019   FREET4 0.96 03/24/2020     Results for MAHEK, SCHLESINGER (MRN [id number]) as of 03/30/2020 14:53  Ref. Range 01/23/2019 10:29 02/25/2020 16:24 03/16/2020 10:54 03/24/2020 16:03  TSH Latest Ref Range: 0.450 - 4.500 uIU/mL 6.220 (H) 0.006 (L)  0.178 (L)  Triiodothyronine,Free,Serum Latest Ref Range: 2.0 - 4.4 pg/mL    2.9  T4,Free(Direct) Latest Ref Range: 0.82 - 1.77 ng/dL    3.15  Thyroxine (T4) Latest Ref Range: 4.5 - 12.0 ug/dL  8.8    Free Thyroxine Index Latest Ref Range: 1.2 - 4.9   2.6    Thyroperoxidase Ab SerPl-aCnc Latest Ref Range: 0 - 34 IU/mL    <8  Thyroglobulin Antibody Latest Ref Range: 0.0 - 0.9 IU/mL    <1.0  T3 Uptake Ratio Latest Ref Range: 24 - 39 %  29    ---------------------------------------------------------------------------------------------------------------------------- US thyroid 03/16/20 CLINICAL DATA:  TSH suppression  EXAM: THYROID ULTRASOUND  TECHNIQUE: Ultrasound examination of the thyroid gland and adjacent soft tissues was performed.  COMPARISON:  None.  FINDINGS: Parenchymal Echotexture: Normal  Isthmus: 2 mm  Right lobe: 4.9 x 1.5 x 1.7 cm  Left lobe: 4.8 x 1.5 x 1.5 cm  _________________________________________________________  Estimated total number of nodules >/= 1 cm: 0  Number of spongiform nodules >/=  2 cm not described below (TR1): 0  Number of mixed cystic and solid nodules >/= 1.5 cm not described below (TR2): 0  _________________________________________________________  No discrete nodules are seen within the thyroid gland.  IMPRESSION: Normal thyroid ultrasound for age  The above is in keeping with the ACR TI-RADS recommendations - J Am Coll Radiol 2017;14:587-595.   Electronically Signed   By: Judie Petit.  Shick M.D.   On: 03/16/2020 11:01   ---------------------------------------------------------------------------------------------------------------------------------------- Uptake  and scan results from 04/07/20 CLINICAL DATA:  Hyperthyroidism; TSH 0.178  EXAM: THYROID SCAN AND UPTAKE - 4 AND 24 HOURS  TECHNIQUE: Following oral administration  of I-123 capsule, anterior planar imaging was acquired at 24 hours. Thyroid uptake was calculated with a thyroid probe at 4-6 hours and 24 hours.  RADIOPHARMACEUTICALS:  414 uCi I-123 sodium iodide p.o.  COMPARISON:  Thyroid ultrasound 03/16/2020  FINDINGS: Homogeneous tracer distribution in both thyroid lobes.  No focal areas of increased or decreased tracer localization seen.  4 hour I-123 uptake = 6% (normal 5-20%)  24 hour I-123 uptake = 13% (normal 10-30%)  IMPRESSION: Normal thyroid scan.  Normal 4 hour and 24 hour radio iodine uptakes.  Please select correct template:   Electronically Signed   By: Ulyses SouthwardMark  Boles M.D.   On: 04/07/2020 11:06 Assessment & Plan:   1.  Subclinical hyperthyroidism:  Her history and most recent labs are reviewed.  Based on her most recent labs with improving yet still suppressed TSH, she most likely has subclinical hyperthyroidism. Her thyroid ultrasound was unremarkable.  It does not appear to be caused by Graves disease as her TPO and thyroglobulin antibodies were undetectable.  She had her uptake and scan which was normal (see above).  Her suppressed TSH levels are likely self- limiting and will resolve on their own with time.  However, given her persistent symptoms, she may benefit from addition of Propanolol 20 mg po twice daily for symptom management.  When her thyroid hormones normalize, we will try stopping the propanolol.   -She will need repeat TFT prior to next visit in 9 weeks.   -Patient is advised to maintain close follow up with Gwenlyn FudgeJoyce, Britney F, FNP for primary care needs.    I spent 20 minutes dedicated to the care of this patient on the date of this encounter to include pre-visit review of records, face-to-face time with the patient, and post  visit ordering of  testing. Elaine HusbandsAshley F Muhl  participated in the discussions, expressed understanding, and voiced agreement with the above plans.  All questions were answered to her satisfaction. she is encouraged to contact clinic should she have any questions or concerns prior to her return visit.  Follow up plan: Return in about 9 weeks (around 06/22/2020) for Thyroid follow up, Previsit labs.   Thank you for involving me in the care of this pleasant patient, and I will continue to update you with her progress.   Ronny BaconWhitney Aurel Nguyen, [facility]ErFNP-BC [facility] [address] [place], [place] [address] Phone: [phone] Fax: [phone]  04/20/2020, 3:39 PM

## 2020-04-26 ENCOUNTER — Other Ambulatory Visit: Payer: Self-pay | Admitting: Family Medicine

## 2020-04-26 DIAGNOSIS — F4310 Post-traumatic stress disorder, unspecified: Secondary | ICD-10-CM

## 2020-04-26 DIAGNOSIS — F32A Depression, unspecified: Secondary | ICD-10-CM

## 2020-04-26 DIAGNOSIS — F411 Generalized anxiety disorder: Secondary | ICD-10-CM

## 2020-04-26 DIAGNOSIS — N809 Endometriosis, unspecified: Secondary | ICD-10-CM

## 2020-04-28 ENCOUNTER — Telehealth: Payer: Self-pay

## 2020-04-28 DIAGNOSIS — F4310 Post-traumatic stress disorder, unspecified: Secondary | ICD-10-CM

## 2020-04-28 DIAGNOSIS — N809 Endometriosis, unspecified: Secondary | ICD-10-CM

## 2020-04-28 DIAGNOSIS — F32A Depression, unspecified: Secondary | ICD-10-CM

## 2020-04-28 DIAGNOSIS — F411 Generalized anxiety disorder: Secondary | ICD-10-CM

## 2020-04-29 MED ORDER — GABAPENTIN 300 MG PO CAPS: 600.0000 mg | ORAL_CAPSULE | Freq: Every day | ORAL | 0 refills | Status: DC

## 2020-04-29 MED ORDER — CITALOPRAM HYDROBROMIDE 40 MG PO TABS: 40.0000 mg | ORAL_TABLET | Freq: Every day | ORAL | 0 refills | Status: DC

## 2020-04-29 NOTE — Telephone Encounter (Signed)
NA / VM full 

## 2020-04-29 NOTE — Telephone Encounter (Signed)
Informed pt billing has her account on hold, when electronic refills come it, it automatically denies them Sent in 30 days refill to pharmacy. Pt will call back to talk to billing dept and then make appt for in a mos

## 2020-05-30 ENCOUNTER — Other Ambulatory Visit: Payer: Self-pay | Admitting: Family Medicine

## 2020-05-30 ENCOUNTER — Other Ambulatory Visit: Payer: Self-pay | Admitting: Family

## 2020-05-30 DIAGNOSIS — F32A Depression, unspecified: Secondary | ICD-10-CM

## 2020-05-30 DIAGNOSIS — F411 Generalized anxiety disorder: Secondary | ICD-10-CM

## 2020-05-30 DIAGNOSIS — F4310 Post-traumatic stress disorder, unspecified: Secondary | ICD-10-CM

## 2020-05-30 DIAGNOSIS — N809 Endometriosis, unspecified: Secondary | ICD-10-CM

## 2020-06-15 ENCOUNTER — Ambulatory Visit (INDEPENDENT_AMBULATORY_CARE_PROVIDER_SITE_OTHER): Payer: 59 | Admitting: Nurse Practitioner

## 2020-06-15 ENCOUNTER — Encounter: Payer: Self-pay | Admitting: Nurse Practitioner

## 2020-06-15 DIAGNOSIS — F411 Generalized anxiety disorder: Secondary | ICD-10-CM | POA: Diagnosis not present

## 2020-06-15 DIAGNOSIS — N809 Endometriosis, unspecified: Secondary | ICD-10-CM | POA: Diagnosis not present

## 2020-06-15 DIAGNOSIS — F4310 Post-traumatic stress disorder, unspecified: Secondary | ICD-10-CM | POA: Diagnosis not present

## 2020-06-15 DIAGNOSIS — F32A Depression, unspecified: Secondary | ICD-10-CM

## 2020-06-15 MED ORDER — GABAPENTIN 300 MG PO CAPS: ORAL_CAPSULE | ORAL | 0 refills | Status: DC

## 2020-06-15 MED ORDER — CITALOPRAM HYDROBROMIDE 40 MG PO TABS: 40.0000 mg | ORAL_TABLET | Freq: Every day | ORAL | 2 refills | Status: DC

## 2020-06-15 MED ORDER — BUSPIRONE HCL 7.5 MG PO TABS: 7.5000 mg | ORAL_TABLET | Freq: Two times a day (BID) | ORAL | 2 refills | Status: DC

## 2020-06-15 NOTE — Progress Notes (Signed)
   Virtual Visit via telephone Note Due to COVID-19 pandemic this visit was conducted virtually. This visit type was conducted due to national recommendations for restrictions regarding the COVID-19 Pandemic (e.g. social distancing, sheltering in place) in an effort to limit this patient's exposure and mitigate transmission in our community. All issues noted in this document were discussed and addressed.  A physical exam was not performed with this format.  I connected with Elaine Lopez on 06/15/20 at 12.08 pm by telephone and verified that I am speaking with the correct person using two identifiers. Elaine Lopez is currently located at home and no one is currently with patient during visit. The provider, Daryll Drown, NP is located in their office at time of visit.  I discussed the limitations, risks, security and privacy concerns of performing an evaluation and management service by telephone and the availability of in person appointments. I also discussed with the patient that there may be a patient responsible charge related to this service. The patient expressed understanding and agreed to proceed.   History and Present Illness:  HPI  Depression and anxiety: Patient complains of depression. She complains of depressed mood and difficulty concentrating. Onset was approximately a few years ago, gradually improving since that time.  She denies current suicidal and homicidal plan or intent.Possible organic causes contributing are: none.  Risk factors: previous episode of depression Previous treatment includes BuSpar and Celexa and BuSpar, Celexa. She complains of the following side effects from the treatment: none.  Review of Systems  Psychiatric/Behavioral: Positive for depression. Negative for suicidal ideas. The patient is nervous/anxious.   All other systems reviewed and are negative.    Observations/Objective: Televisit  Assessment and Plan:  Patient well-controlled on  current medication, Celexa 40 mg tablet daily, BuSpar 7.5 mg tablet twice daily, no changes necessary.  Continue to provide education to patient.   Follow Up Instructions: Follow-up in 3 months    I discussed the assessment and treatment plan with the patient. The patient was provided an opportunity to ask questions and all were answered. The patient agreed with the plan and demonstrated an understanding of the instructions.   The patient was advised to call back or seek an in-person evaluation if the symptoms worsen or if the condition fails to improve as anticipated.  The above assessment and management plan was discussed with the patient. The patient verbalized understanding of and has agreed to the management plan. Patient is aware to call the clinic if symptoms persist or worsen. Patient is aware when to return to the clinic for a follow-up visit. Patient educated on when it is appropriate to go to the emergency department.   Time call ended: 12:23 PM  I provided 15 minutes of non-face-to-face time during this encounter.    Daryll Drown, NP

## 2020-06-15 NOTE — Assessment & Plan Note (Addendum)
Patient well-controlled on current medication, Celexa 40 mg tablet daily, BuSpar 7.5 mg tablet twice daily, no changes necessary.  Continue to provide education to patient.  Completed GAD-7/PHQ-9.  Follow-up in 3 months  Rx refill sent to pharmacy.

## 2020-06-15 NOTE — Assessment & Plan Note (Signed)
Generalized anxiety well managed on current medication.  No changes to current dose.  Completed GAD-7.  Follow-up in 3 months

## 2020-06-22 ENCOUNTER — Ambulatory Visit: Payer: 59 | Admitting: Nurse Practitioner

## 2020-07-24 ENCOUNTER — Other Ambulatory Visit: Payer: Self-pay | Admitting: Nurse Practitioner

## 2020-07-24 DIAGNOSIS — N809 Endometriosis, unspecified: Secondary | ICD-10-CM

## 2020-07-25 MED ORDER — GABAPENTIN 300 MG PO CAPS: ORAL_CAPSULE | ORAL | 0 refills | Status: DC

## 2020-08-26 ENCOUNTER — Other Ambulatory Visit: Payer: Self-pay | Admitting: Family Medicine

## 2020-08-26 DIAGNOSIS — N809 Endometriosis, unspecified: Secondary | ICD-10-CM

## 2020-08-26 MED ORDER — GABAPENTIN 300 MG PO CAPS: ORAL_CAPSULE | ORAL | 0 refills | Status: DC

## 2020-09-27 ENCOUNTER — Other Ambulatory Visit: Payer: Self-pay | Admitting: Family Medicine

## 2020-09-27 DIAGNOSIS — N809 Endometriosis, unspecified: Secondary | ICD-10-CM

## 2020-09-28 ENCOUNTER — Other Ambulatory Visit: Payer: Self-pay | Admitting: *Deleted

## 2020-09-28 DIAGNOSIS — F411 Generalized anxiety disorder: Secondary | ICD-10-CM

## 2020-09-28 DIAGNOSIS — F4310 Post-traumatic stress disorder, unspecified: Secondary | ICD-10-CM

## 2020-09-28 DIAGNOSIS — F32A Depression, unspecified: Secondary | ICD-10-CM

## 2020-09-28 MED ORDER — CITALOPRAM HYDROBROMIDE 40 MG PO TABS: 40.0000 mg | ORAL_TABLET | Freq: Every day | ORAL | 0 refills | Status: DC

## 2020-09-28 MED ORDER — BUSPIRONE HCL 7.5 MG PO TABS: 7.5000 mg | ORAL_TABLET | Freq: Two times a day (BID) | ORAL | 0 refills | Status: DC

## 2020-10-18 ENCOUNTER — Other Ambulatory Visit: Payer: Self-pay | Admitting: Nurse Practitioner

## 2020-10-26 ENCOUNTER — Other Ambulatory Visit: Payer: Self-pay | Admitting: Family Medicine

## 2020-10-26 ENCOUNTER — Other Ambulatory Visit: Payer: Self-pay | Admitting: Nurse Practitioner

## 2020-10-26 DIAGNOSIS — N809 Endometriosis, unspecified: Secondary | ICD-10-CM

## 2020-10-26 DIAGNOSIS — F32A Depression, unspecified: Secondary | ICD-10-CM

## 2020-10-26 DIAGNOSIS — F411 Generalized anxiety disorder: Secondary | ICD-10-CM

## 2020-10-26 DIAGNOSIS — F4310 Post-traumatic stress disorder, unspecified: Secondary | ICD-10-CM

## 2020-10-28 ENCOUNTER — Other Ambulatory Visit: Payer: Self-pay | Admitting: Nurse Practitioner

## 2020-10-28 ENCOUNTER — Other Ambulatory Visit: Payer: Self-pay | Admitting: Family Medicine

## 2020-10-28 DIAGNOSIS — F411 Generalized anxiety disorder: Secondary | ICD-10-CM

## 2020-10-28 DIAGNOSIS — F32A Depression, unspecified: Secondary | ICD-10-CM

## 2020-10-28 DIAGNOSIS — F4310 Post-traumatic stress disorder, unspecified: Secondary | ICD-10-CM

## 2020-11-17 ENCOUNTER — Ambulatory Visit (INDEPENDENT_AMBULATORY_CARE_PROVIDER_SITE_OTHER): Payer: 59 | Admitting: Family Medicine

## 2020-11-17 ENCOUNTER — Other Ambulatory Visit: Payer: Self-pay

## 2020-11-17 ENCOUNTER — Encounter: Payer: Self-pay | Admitting: Family Medicine

## 2020-11-17 VITALS — BP 116/84 | HR 85 | Temp 97.9°F | Ht 70.0 in | Wt 241.2 lb

## 2020-11-17 DIAGNOSIS — N809 Endometriosis, unspecified: Secondary | ICD-10-CM | POA: Diagnosis not present

## 2020-11-17 DIAGNOSIS — F411 Generalized anxiety disorder: Secondary | ICD-10-CM

## 2020-11-17 DIAGNOSIS — F4310 Post-traumatic stress disorder, unspecified: Secondary | ICD-10-CM | POA: Diagnosis not present

## 2020-11-17 DIAGNOSIS — R5383 Other fatigue: Secondary | ICD-10-CM

## 2020-11-17 DIAGNOSIS — F32A Depression, unspecified: Secondary | ICD-10-CM

## 2020-11-17 DIAGNOSIS — E0590 Thyrotoxicosis, unspecified without thyrotoxic crisis or storm: Secondary | ICD-10-CM

## 2020-11-17 MED ORDER — CITALOPRAM HYDROBROMIDE 40 MG PO TABS: 40.0000 mg | ORAL_TABLET | Freq: Every day | ORAL | 1 refills | Status: DC

## 2020-11-17 MED ORDER — HYDROXYZINE HCL 10 MG PO TABS: 10.0000 mg | ORAL_TABLET | Freq: Every day | ORAL | 1 refills | Status: DC

## 2020-11-17 MED ORDER — GABAPENTIN 300 MG PO CAPS: 600.0000 mg | ORAL_CAPSULE | Freq: Every day | ORAL | 1 refills | Status: DC

## 2020-11-17 MED ORDER — BUSPIRONE HCL 7.5 MG PO TABS: 7.5000 mg | ORAL_TABLET | Freq: Two times a day (BID) | ORAL | 1 refills | Status: DC

## 2020-11-17 MED ORDER — PROPRANOLOL HCL 20 MG PO TABS: 20.0000 mg | ORAL_TABLET | Freq: Two times a day (BID) | ORAL | 1 refills | Status: DC

## 2020-11-17 NOTE — Progress Notes (Signed)
Assessment & Plan:  1. Generalized anxiety disorder Uncontrolled without medication. Restarted Celexa and BuSpar. Rx'd Hydroxyzine. - citalopram (CELEXA) 40 MG tablet; Take 1 tablet (40 mg total) by mouth daily.  Dispense: 90 tablet; Refill: 1 - busPIRone (BUSPAR) 7.5 MG tablet; Take 1 tablet (7.5 mg total) by mouth 2 (two) times daily.  Dispense: 180 tablet; Refill: 1 - CBC with Differential/Platelet - CMP14+EGFR - hydrOXYzine (ATARAX/VISTARIL) 10 MG tablet; Take 1 tablet (10 mg total) by mouth at bedtime.  Dispense: 90 tablet; Refill: 1  2. Chronic depression Uncontrolled without medication. Restarted Celexa and BuSpar.  - citalopram (CELEXA) 40 MG tablet; Take 1 tablet (40 mg total) by mouth daily.  Dispense: 90 tablet; Refill: 1 - CBC with Differential/Platelet - CMP14+EGFR  3. PTSD (post-traumatic stress disorder) Uncontrolled without medication. Restarted Celexa and BuSpar.  - citalopram (CELEXA) 40 MG tablet; Take 1 tablet (40 mg total) by mouth daily.  Dispense: 90 tablet; Refill: 1  4. Endometriosis Well controlled on current regimen.  - gabapentin (NEURONTIN) 300 MG capsule; Take 2 capsules (600 mg total) by mouth at bedtime.  Dispense: 180 capsule; Refill: 1  5. Subclinical hyperthyroidism Due for repeat lab work. - propranolol (INDERAL) 20 MG tablet; Take 1 tablet (20 mg total) by mouth 2 (two) times daily.  Dispense: 180 tablet; Refill: 1 - T4, free - TSH - T3, Free  6. Tired Due for thyroid labs. Anxiety and depression currently uncontrolled.    Return in about 6 weeks (around 12/29/2020) for anxiety/depression.  Hendricks Limes, MSN, APRN, FNP-C [facility]  Subjective:    [name], female    DOB: 10-26-1990, 30 y.o.   MRN: [id number]  Patient Care Team: Loman Brooklyn, FNP as PCP - General (Family Medicine) Dr. Hoyle Barr as Consulting Physician (Psychiatry) [facility] as Consulting Physician   Chief  Complaint:  Chief Complaint  Patient presents with   Anxiety   Depression    Check up of chronic medical conditions     HPI: Elaine Lopez is a 30 y.o. female presenting on 11/17/2020 for Anxiety and Depression (Check up of chronic medical conditions )  Patient is out of her medications to treat anxiety and depression. She has an old prescription of Hydroxyzine 10 mg that she has been taking at bedtime.   Depression screen [facility] 2/9 11/17/2020 06/15/2020 02/25/2020  Decreased Interest '3 1 1  ' Down, Depressed, Hopeless 3 0 0  PHQ - 2 Score '6 1 1  ' Altered sleeping '3 3 3  ' Tired, decreased energy '3 3 3  ' Change in appetite 0 2 0  Feeling bad or failure about yourself  3 1 0  Trouble concentrating '2 1 2  ' Moving slowly or fidgety/restless 3 0 0  Suicidal thoughts 2 0 0  PHQ-9 Score '22 11 9  ' Difficult doing work/chores Not difficult at all Somewhat difficult -   GAD 7 : Generalized Anxiety Score 11/17/2020 06/15/2020 02/25/2020 07/23/2019  Nervous, Anxious, on Edge '3 2 3 2  ' Control/stop worrying '3 1 3 3  ' Worry too much - different things '3 2 1 3  ' Trouble relaxing '3 2 3 2  ' Restless '2 1 1 ' 0  Easily annoyed or irritable '3 1 2 1  ' Afraid - awful might happen '2 2 2 2  ' Total GAD 7 Score '19 11 15 13  ' Anxiety Difficulty Extremely difficult Somewhat difficult - Somewhat difficult    New complaints: Patient continues to feel fatigued during  the day. She is taking caffeine 240 mg twice daily to keep herself going.    Social history:  Relevant past medical, surgical, family and social history reviewed and updated as indicated. Interim medical history since our last visit reviewed.  Allergies and medications reviewed and updated.  DATA REVIEWED: CHART IN EPIC  ROS: Negative unless specifically indicated above in HPI.    Current Outpatient Medications:    busPIRone (BUSPAR) 7.5 MG tablet, Take 1 tablet (7.5 mg total) by mouth 2 (two) times daily. (NEEDS TO BE SEEN BEFORE NEXT REFILL), Disp: 60  tablet, Rfl: 0   citalopram (CELEXA) 40 MG tablet, Take 1 tablet (40 mg total) by mouth daily. (NEEDS TO BE SEEN BEFORE NEXT REFILL), Disp: 30 tablet, Rfl: 0   gabapentin (NEURONTIN) 300 MG capsule, TAKE 2 CAPSULES BY MOUTH ONCE DAILY AT BEDTIME  (NEEDS TO BE SEEN BEFORE NEXT REFILL), Disp: 60 capsule, Rfl: 0   Melatonin 10 MG CAPS, Take 2 capsules by mouth at bedtime. , Disp: , Rfl:    Omeprazole 20 MG TBEC, Take by mouth., Disp: , Rfl:    OVER THE COUNTER MEDICATION, 1,000 Int'l Units/1.12m. Vit D, Disp: , Rfl:    propranolol (INDERAL) 20 MG tablet, Take 1 tablet by mouth twice daily, Disp: 180 tablet, Rfl: 0   VITAMIN D, CHOLECALCIFEROL, PO, Take 1,000 Units by mouth daily., Disp: , Rfl:    No Known Allergies Past Medical History:  Diagnosis Date   Abnormal TSH 01/26/2019   Allergy    Chronic depression    Endometriosis    stage 4    History of drug abuse (HCC)    Lumbar herniated disc    x3   PTSD (post-traumatic stress disorder)    Vitamin D insufficiency 01/26/2019    Past Surgical History:  Procedure Laterality Date   laproscopy     x3   LUMBAR EPIDURAL INJECTION     TONSILLECTOMY AND ADENOIDECTOMY      Social History   Socioeconomic History   Marital status: Single    Spouse name: Not on file   Number of children: 0   Years of education: Not on file   Highest education level: Not on file  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   Sexual activity: Not on file  Other Topics Concern   Not on file  Social History Narrative   Not on file   Social Determinants of Health   Financial Resource Strain: Not on file  Food  Insecurity: Not on file  Transportation Needs: Not on file  Physical Activity: Not on file  Stress: Not on file  Social Connections: Not on file  Intimate Partner Violence: Not on file        Objective:    BP 116/84   Pulse 85   Temp 97.9 F (36.6 C) (Temporal)   Ht '5\' 10"'  (1.778 m)   Wt 241 lb 3.2 oz (109.4 kg)   SpO2 97%   BMI 34.61 kg/m   Wt Readings from Last 3 Encounters:  11/17/20 241 lb  3.2 oz (109.4 kg)  03/09/20 244 lb (110.7 kg)  02/25/20 243 lb 6.4 oz (110.4 kg)    Physical Exam Vitals reviewed.  Constitutional:      General: She is not in acute distress.    Appearance: Normal appearance. She is obese. She is not ill-appearing, toxic-appearing or diaphoretic.  HENT:     Head: Normocephalic and atraumatic.  Eyes:     General: No scleral icterus.       Right eye: No discharge.        Left eye: No discharge.     Conjunctiva/sclera: Conjunctivae normal.  Cardiovascular:     Rate and Rhythm: Normal rate and regular rhythm.     Heart sounds: Normal heart sounds. No murmur heard.   No friction rub. No gallop.  Pulmonary:     Effort: Pulmonary effort is normal. No respiratory distress.     Breath sounds: Normal breath sounds. No stridor. No wheezing, rhonchi or rales.  Musculoskeletal:        General: Normal range of motion.     Cervical back: Normal range of motion.  Skin:    General: Skin is warm and dry.     Capillary Refill: Capillary refill takes less than 2 seconds.  Neurological:     General: No focal deficit present.     Mental Status: She is alert and oriented to person, place, and time. Mental status is at baseline.  Psychiatric:        Mood and Affect: Mood normal.        Behavior: Behavior normal.        Thought Content: Thought content normal.        Judgment: Judgment normal.    Lab Results  Component Value Date   TSH 0.178 (L) 03/24/2020   Lab Results  Component Value Date   WBC 8.8 02/25/2020   HGB 12.7 02/25/2020   HCT 38.3  02/25/2020   MCV 86 02/25/2020   PLT 374 02/25/2020   Lab Results  Component Value Date   NA 141 02/25/2020   K 4.6 02/25/2020   CO2 24 02/25/2020   GLUCOSE 93 02/25/2020   BUN 8 02/25/2020   CREATININE 0.54 (L) 02/25/2020   BILITOT 0.3 02/25/2020   ALKPHOS 132 (H) 02/25/2020   AST 22 02/25/2020   ALT 32 02/25/2020   PROT 7.1 02/25/2020   ALBUMIN 4.2 02/25/2020   CALCIUM 9.7 02/25/2020   Lab Results  Component Value Date   CHOL 153 02/25/2020   Lab Results  Component Value Date   HDL 33 (L) 02/25/2020   Lab Results  Component Value Date   LDLCALC 93 02/25/2020   Lab Results  Component Value Date   TRIG 154 (H) 02/25/2020   Lab Results  Component Value Date   CHOLHDL 4.6 (H) 02/25/2020   No results found for: HGBA1C

## 2020-11-18 LAB — CMP14+EGFR
ALT: 26 IU/L (ref 0–32)
AST: 17 IU/L (ref 0–40)
Albumin/Globulin Ratio: 1.8 (ref 1.2–2.2)
Albumin: 4.6 g/dL (ref 3.9–5.0)
Alkaline Phosphatase: 102 IU/L (ref 44–121)
BUN/Creatinine Ratio: 15 (ref 9–23)
BUN: 10 mg/dL (ref 6–20)
Bilirubin Total: 0.5 mg/dL (ref 0.0–1.2)
CO2: 22 mmol/L (ref 20–29)
Calcium: 9.8 mg/dL (ref 8.7–10.2)
Chloride: 104 mmol/L (ref 96–106)
Creatinine, Ser: 0.66 mg/dL (ref 0.57–1.00)
Globulin, Total: 2.5 g/dL (ref 1.5–4.5)
Glucose: 88 mg/dL (ref 65–99)
Potassium: 4.6 mmol/L (ref 3.5–5.2)
Sodium: 139 mmol/L (ref 134–144)
Total Protein: 7.1 g/dL (ref 6.0–8.5)
eGFR: 122 mL/min/{1.73_m2} (ref >59)

## 2020-11-18 LAB — CBC WITH DIFFERENTIAL/PLATELET
Basophils Absolute: 0.1 10*3/uL (ref 0.0–0.2)
Basos: 1 %
EOS (ABSOLUTE): 0.1 10*3/uL (ref 0.0–0.4)
Eos: 1 %
Hematocrit: 38.1 % (ref 34.0–46.6)
Hemoglobin: 12.8 g/dL (ref 11.1–15.9)
Immature Grans (Abs): 0 10*3/uL (ref 0.0–0.1)
Immature Granulocytes: 0 %
Lymphocytes Absolute: 2.3 10*3/uL (ref 0.7–3.1)
Lymphs: 23 %
MCH: 29.3 pg (ref 26.6–33.0)
MCHC: 33.6 g/dL (ref 31.5–35.7)
MCV: 87 fL (ref 79–97)
Monocytes Absolute: 0.8 10*3/uL (ref 0.1–0.9)
Monocytes: 7 %
Neutrophils Absolute: 6.8 10*3/uL (ref 1.4–7.0)
Neutrophils: 68 %
Platelets: 370 10*3/uL (ref 150–450)
RBC: 4.37 x10E6/uL (ref 3.77–5.28)
RDW: 12.2 % (ref 11.7–15.4)
WBC: 10.1 10*3/uL (ref 3.4–10.8)

## 2020-11-18 LAB — T4, FREE: Free T4: 1.04 ng/dL (ref 0.82–1.77)

## 2020-11-18 LAB — TSH: TSH: 1.99 u[IU]/mL (ref 0.450–4.500)

## 2020-11-18 LAB — T3, FREE: T3, Free: 3.5 pg/mL (ref 2.0–4.4)

## 2020-11-27 ENCOUNTER — Other Ambulatory Visit: Payer: Self-pay

## 2020-11-27 ENCOUNTER — Encounter (HOSPITAL_COMMUNITY): Payer: Self-pay

## 2020-11-27 ENCOUNTER — Emergency Department (HOSPITAL_COMMUNITY)
Admission: EM | Admit: 2020-11-27 | Discharge: 2020-11-27 | Disposition: A | Discharge status: Home / Self Care | Payer: 59 | Attending: Emergency Medicine | Admitting: Emergency Medicine

## 2020-11-27 DIAGNOSIS — F17210 Nicotine dependence, cigarettes, uncomplicated: Secondary | ICD-10-CM | POA: Diagnosis not present

## 2020-11-27 DIAGNOSIS — S199XXA Unspecified injury of neck, initial encounter: Secondary | ICD-10-CM | POA: Diagnosis present

## 2020-11-27 DIAGNOSIS — E0590 Thyrotoxicosis, unspecified without thyrotoxic crisis or storm: Secondary | ICD-10-CM | POA: Insufficient documentation

## 2020-11-27 DIAGNOSIS — S161XXA Strain of muscle, fascia and tendon at neck level, initial encounter: Secondary | ICD-10-CM | POA: Insufficient documentation

## 2020-11-27 DIAGNOSIS — T148XXA Other injury of unspecified body region, initial encounter: Secondary | ICD-10-CM

## 2020-11-27 DIAGNOSIS — X509XXA Other and unspecified overexertion or strenuous movements or postures, initial encounter: Secondary | ICD-10-CM | POA: Diagnosis not present

## 2020-11-27 DIAGNOSIS — M542 Cervicalgia: Secondary | ICD-10-CM

## 2020-11-27 MED ORDER — KETOROLAC TROMETHAMINE 30 MG/ML IJ SOLN
60.0000 mg | Freq: Once | INTRAMUSCULAR | Status: AC
  Administered 2020-11-27: 60 mg via INTRAMUSCULAR
  Filled 2020-11-27: qty 2

## 2020-11-27 MED ORDER — METHOCARBAMOL 500 MG PO TABS: 500.0000 mg | ORAL_TABLET | Freq: Two times a day (BID) | ORAL | 0 refills | Status: DC

## 2020-11-27 MED ORDER — METHOCARBAMOL 500 MG PO TABS
500.0000 mg | ORAL_TABLET | Freq: Once | ORAL | Status: AC
  Administered 2020-11-27: 500 mg via ORAL
  Filled 2020-11-27: qty 1

## 2020-11-27 MED ORDER — NAPROXEN 500 MG PO TABS: 500.0000 mg | ORAL_TABLET | Freq: Two times a day (BID) | ORAL | 0 refills | Status: DC

## 2020-11-27 NOTE — Discharge Instructions (Addendum)
Please follow up with your PCP regarding ED visit today  Pick up medication and take as prescribed. DO NOT DRIVE WHILE ON THE MUSCLE RELAXER AS IT CAN MAKE YOU DROWSY.   You can also apply warm compresses to your neck to help with pain.   Return to the ED for any new/worsening symptoms

## 2020-11-27 NOTE — ED Provider Notes (Signed)
[facility] EMERGENCY DEPARTMENT Provider Note   CSN: [id number] Arrival date & time: 11/27/20  1017     History Chief Complaint  Patient presents with   Torticollis    Elaine Lopez is a 30 y.o. female with PMHx depression, PTSD, lumbar herniated disc who presents to the ED today with complaint of sudden onset, constant, achy, neck pain and stiffness that began around 8 AM today. Pt reports she was laying in bed and went to roll on her side when she felt a "pop" in her neck and immediate pain to the base as well as bilateral shoulders. She states that since that time her neck has been stiff and she has been unable to move it like she normally dose. She has not taken anything for pain PTA. She does mention hx of lumbar herniated discs in the past which she has seen a chiropractor for. No hx of cervical herniated disc. Pt denies weakness, numbness, tingling to BUEs. No headaches, vision changes.   The history is provided by the patient and medical records.      Past Medical History:  Diagnosis Date   Abnormal TSH 01/26/2019   Allergy    Chronic depression    Endometriosis    stage 4    History of drug abuse (HCC)    Lumbar herniated disc    x3   PTSD (post-traumatic stress disorder)    Vitamin D insufficiency 01/26/2019    Patient Active Problem List   Diagnosis Date Noted   Subclinical hyperthyroidism 11/27/2020   Generalized anxiety disorder 06/15/2020   Vitamin D insufficiency 01/26/2019   PTSD (post-traumatic stress disorder)    Chronic depression    History of drug abuse (HCC)    Endometriosis     Past Surgical History:  Procedure Laterality Date   laproscopy     x3   LUMBAR EPIDURAL INJECTION     TONSILLECTOMY AND ADENOIDECTOMY       OB History   No obstetric history on file.     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    Home Medications Prior to Admission medications   Medication Sig Start Date End Date Taking? Authorizing Provider  methocarbamol (ROBAXIN) 500 MG tablet Take 1 tablet (500 mg total) by mouth 2 (two) times daily. 11/27/20  Yes Kevionna Heffler, PA-C  naproxen (NAPROSYN) 500 MG tablet Take 1 tablet (500 mg total) by mouth 2 (two) times daily. 11/27/20  Yes Markes Shatswell, PA-C  busPIRone (BUSPAR) 7.5 MG tablet Take 1 tablet (7.5 mg total) by mouth 2 (two) times daily. 11/17/20   Gwenlyn Fudge, FNP  citalopram (CELEXA) 40 MG tablet Take 1 tablet (40 mg total) by mouth daily. 11/17/20   Gwenlyn Fudge, FNP  gabapentin (NEURONTIN) 300 MG capsule Take 2 capsules (600 mg total) by mouth at bedtime. 11/17/20   Gwenlyn Fudge, FNP  hydrOXYzine (ATARAX/VISTARIL) 10 MG tablet Take 1 tablet (10 mg total) by mouth at bedtime. 11/17/20   Gwenlyn Fudge, FNP  Melatonin 10 MG CAPS Take 2 capsules by mouth at bedtime.     [provider]  Omeprazole 20 MG TBEC Take by mouth.    [provider]  OVER THE COUNTER MEDICATION 1,000 Int'l Units/1.40m2. Vit D    [provider]  propranolol (INDERAL) 20 MG tablet Take 1 tablet (20 mg total) by mouth 2 (two) times daily. 11/17/20   Deliah Boston F, FNP  VITAMIN D, CHOLECALCIFEROL, PO Take 1,000 Units by mouth daily.    [provider]    Allergies    Patient has no known allergies.  Review of Systems    Review of Systems  Constitutional:  Negative for chills and fever.  Eyes:  Negative for visual disturbance.  Musculoskeletal:  Positive for neck pain and neck stiffness.  Neurological:  Negative for dizziness, syncope, weakness, light-headedness, numbness and headaches.  All other systems reviewed and are negative.  Physical Exam Updated Vital Signs BP 124/86 (BP Location: Right Arm)   Pulse 82   Temp 97.7 F (36.5 C) (Oral)   Resp 18   Ht 5\' 10"  (1.778 m)   Wt 108.9 kg   SpO2 98%   BMI 34.44 kg/m   Physical Exam Vitals and nursing note reviewed.  Constitutional:      Appearance: She is not ill-appearing.  HENT:     Head: Normocephalic and atraumatic.  Eyes:     Conjunctiva/sclera: Conjunctivae normal.  Neck:     Comments: No midline C spine TTP. + left paracervical musculature TTP. ROM limited s/2 pain. Pt unable to touch chin to chest s/2 pain. She is able to lift her head and look up at the ceiling. Lateral rotation slightly limited s/2 pain as well. Strength 5/5 to BUEs. Sensation intact throughout. 2+ radial pulses.  Cardiovascular:     Rate and Rhythm: Normal rate and regular rhythm.     Pulses: Normal pulses.  Pulmonary:     Effort: Pulmonary effort is normal.     Breath sounds: Normal breath sounds. No wheezing or rhonchi.  Skin:    General: Skin is warm and dry.     Coloration: Skin is not jaundiced.  Neurological:     Mental Status: She is alert.    ED Results / Procedures / Treatments   Labs (all labs ordered are listed, but only abnormal results are displayed) Labs Reviewed - No data to display  EKG None  Radiology No results found.  Procedures Procedures   Medications Ordered in ED Medications  methocarbamol (ROBAXIN) tablet 500 mg (500 mg Oral Given 11/27/20 1143)  ketorolac (TORADOL) 30 MG/ML injection 60 mg (60 mg Intramuscular Given 11/27/20 1350)    ED Course  I have reviewed the triage vital signs and the nursing notes.  Pertinent  labs & imaging results that were available during my care of the patient were reviewed by me and considered in my medical decision making (see chart for details).    MDM Rules/Calculators/A&P  30 year old female who presents to the ED today with complaint of sudden onset neck pain/stiffness since turning on her side while rolling in bed this AM.  Has not taken anything for pain.  On arrival to the ED patient is afebrile, nontachycardic and nontachypneic and appears to be in no acute distress.  She is laying very still in the bed due to stiffness in her neck.  On my exam she has no midline spinal tenderness, step-offs, deformities.  She does have some left paracervical musculature tenderness palpation with spasming.  Her range of motion is limited secondary to pain.  She is neurovascularly intact throughout.  No complaints of headaches, vision changes, dizziness, lightheadedness, numbness/tingling.  Very low suspicion for febrile dissection given patient is rolling around in bed.  We will plan to provide muscle relaxer and reassess.  I do not feel x-rays are warranted at this time.  Patient denies risk of pregnancy, has not been sexually active since March and had a period last month.   Mild relief with robaxin and toradol. Will discharge home at this time with Rx muscle relaxer. Pt instructed on warm compresses and PCP follow up.   This note was prepared using Dragon voice recognition software and may include unintentional dictation errors due to the inherent limitations of voice recognition software.  Final Clinical Impression(s) / ED Diagnoses Final diagnoses:  Neck pain  Muscle strain    Rx / DC Orders ED Discharge Orders          Ordered    methocarbamol (ROBAXIN) 500 MG tablet  2 times daily        11/27/20 1422    naproxen (NAPROSYN) 500 MG tablet  2 times daily        11/27/20 1422             Discharge Instructions      Please follow up with your  PCP regarding ED visit today  Pick up medication and take as prescribed. DO NOT DRIVE WHILE ON THE MUSCLE RELAXER AS IT CAN MAKE YOU DROWSY.   You can also apply warm compresses to your neck to help with pain.   Return to the ED for any new/worsening symptoms       Tanda Rockers, Cordelia Poche 11/27/20 1426    Mancel Bale, MD 11/27/20 [phone]

## 2020-11-27 NOTE — ED Triage Notes (Signed)
Pt presents to ED with complaints of stiff neck and pop in her neck since 0800 this am. Pt states she has decreased ROM.

## 2021-02-25 ENCOUNTER — Other Ambulatory Visit: Payer: Self-pay | Admitting: Family Medicine

## 2021-02-25 DIAGNOSIS — F411 Generalized anxiety disorder: Secondary | ICD-10-CM

## 2021-02-25 DIAGNOSIS — F4310 Post-traumatic stress disorder, unspecified: Secondary | ICD-10-CM

## 2021-02-25 DIAGNOSIS — F32A Depression, unspecified: Secondary | ICD-10-CM

## 2021-03-29 ENCOUNTER — Encounter: Payer: 59 | Admitting: Women's Health

## 2021-04-03 ENCOUNTER — Encounter: Payer: Self-pay | Admitting: Women's Health

## 2021-04-03 ENCOUNTER — Other Ambulatory Visit: Payer: Self-pay

## 2021-04-03 ENCOUNTER — Other Ambulatory Visit (HOSPITAL_COMMUNITY)
Admission: RE | Admit: 2021-04-03 | Discharge: 2021-04-03 | Disposition: A | Discharge status: Home / Self Care | Payer: 59 | Source: Ambulatory Visit | Attending: Women's Health | Admitting: Women's Health

## 2021-04-03 ENCOUNTER — Ambulatory Visit (INDEPENDENT_AMBULATORY_CARE_PROVIDER_SITE_OTHER): Payer: 59 | Admitting: Women's Health

## 2021-04-03 VITALS — BP 109/78 | HR 74 | Ht 70.0 in | Wt 245.0 lb

## 2021-04-03 DIAGNOSIS — Z01419 Encounter for gynecological examination (general) (routine) without abnormal findings: Secondary | ICD-10-CM | POA: Insufficient documentation

## 2021-04-03 DIAGNOSIS — N809 Endometriosis, unspecified: Secondary | ICD-10-CM

## 2021-04-03 DIAGNOSIS — Z30432 Encounter for removal of intrauterine contraceptive device: Secondary | ICD-10-CM | POA: Diagnosis not present

## 2021-04-03 DIAGNOSIS — Z113 Encounter for screening for infections with a predominantly sexual mode of transmission: Secondary | ICD-10-CM | POA: Diagnosis not present

## 2021-04-03 MED ORDER — MYFEMBREE 40-1-0.5 MG PO TABS: 1.0000 | ORAL_TABLET | Freq: Every day | ORAL | 11 refills | Status: DC

## 2021-04-03 NOTE — Progress Notes (Signed)
WELL-WOMAN EXAMINATION Patient name: Elaine Lopez MRN [id number]  Date of birth: 09/01/1990 Chief Complaint:   Gynecologic Exam and New Patient (Initial Visit)  History of Present Illness:   Elaine Lopez is a 30 y.o. G0P0000 Caucasian female being seen today for a routine well-woman exam.  Current complaints: 2019-her ob/gyn did surgery- realized she needed specialist. So 2nd surgery was done in 2019, dx w/ stage 4 endometriosis, on bladder/bowel, had started eroding into bladder, Mirena IUD placed at this time- hasn't helped at all. Wants it removed today. Still having periods, painful, painful even when not on period, painful sex. Sept had period x 2wks, bloody d/c. Increased vaginal d/c, no odor/itching/irritation. Wants std screen, including bloodwork.   PCP: Deliah Boston      Patient's last menstrual period was 03/08/2021 (approximate). The current method of family planning is IUD.  Last pap 2019. Results were: negative per pt report at office in [place] . H/O abnormal pap: no Last mammogram: never. Results were: N/A. Family h/o breast cancer: no Last colonoscopy: never. Results were: N/A. Family h/o colorectal cancer: no  Depression screen [facility] 2/9 04/03/2021 11/17/2020 06/15/2020 02/25/2020 07/23/2019  Decreased Interest 1 3 1 1 1   Down, Depressed, Hopeless 1 3 0 0 1  PHQ - 2 Score 2 6 1 1 2   Altered sleeping 3 3 3 3 3   Tired, decreased energy 3 3 3 3 2   Change in appetite 3 0 2 0 3  Feeling bad or failure about yourself  0 3 1 0 0  Trouble concentrating 2 2 1 2 1   Moving slowly or fidgety/restless 0 3 0 0 0  Suicidal thoughts 0 2 0 0 0  PHQ-9 Score 13 22 11 9 11   Difficult doing work/chores - Not difficult at all Somewhat difficult - Somewhat difficult     GAD 7 : Generalized Anxiety Score 04/03/2021 11/17/2020 06/15/2020 02/25/2020  Nervous, Anxious, on Edge 2 3 2 3   Control/stop worrying 2 3 1 3   Worry too much - different things 2 3 2 1   Trouble relaxing 2 3 2 3   Restless 0 2 1  1   Easily annoyed or irritable 2 3 1 2   Afraid - awful might happen 0 2 2 2   Total GAD 7 Score 10 19 11 15   Anxiety Difficulty - Extremely difficult Somewhat difficult -     Review of Systems:   Pertinent items are noted in HPI Denies any headaches, blurred vision, fatigue, shortness of breath, chest pain, abdominal pain, abnormal vaginal discharge/itching/odor/irritation, problems with periods, bowel movements, urination, or intercourse unless otherwise stated above. Pertinent History Reviewed:  Reviewed past medical,surgical, social and family history.  Reviewed problem list, medications and allergies. Physical Assessment:   Vitals:   04/03/21 1101  BP: 109/78  Pulse: 74  Weight: 245 lb (111.1 kg)  Height: 5\' 10"  (1.778 m)  Body mass index is 35.15 kg/m.        Physical Examination:   General appearance - well appearing, and in no distress  Mental status - alert, oriented to person, place, and time  Psych:  She has a normal mood and affect  Skin - warm and dry, normal color, no suspicious lesions noted  Chest - effort normal, all lung fields clear to auscultation bilaterally  Heart - normal rate and regular rhythm  Neck:  midline trachea, no thyromegaly or nodules  Breasts - breasts appear normal, no suspicious masses, no skin or nipple changes or  axillary nodes  Abdomen - soft, nontender, nondistended, no masses or organomegaly  Pelvic - VULVA: normal appearing vulva with no masses, tenderness or lesions  VAGINA: normal appearing vagina with normal color and discharge, no lesions  CERVIX: normal appearing cervix without discharge or lesions, no CMT  Thin prep pap is done w/ HR HPV cotesting  UTERUS: uterus is felt to be normal size, shape, consistency and nontender   ADNEXA: No adnexal masses or tenderness noted.  Extremities:  No swelling or varicosities noted  IUD REMOVAL Time out was performed.  A graves speculum was placed in the vagina.  The cervix was visualized,  and the strings were visible. They were grasped and the Mirena  IUD was easily removed intact without complications. The patient tolerated the procedure well.   Chaperone: [employer]    No results found for this or any previous visit (from the past 24 hour(s)).  Assessment & Plan:  1) Well-Woman Exam  2) Stage 4 endometriosis w/ pelvic pain and dyspareunia> discussed trialing Myfembree, wants to try, 2 samples give, rx sent to [facility], f/u  3) IUD removal> Mirena was placed to help w/ pain/periods, hasn't- so wanted removed. Plans condoms for now  4) STD screen>gc/ct/trich on pap, hiv/rpr blood  Labs/procedures today: as below  Mammogram: @ 29yo, or sooner if problems Colonoscopy: @ 30yo, or sooner if problems  Orders Placed This Encounter  Procedures   HIV Antibody (routine testing w rflx)   RPR    Meds:  Meds ordered this encounter  Medications   Relugolix-Estradiol-Norethind (MYFEMBREE) 40-1-0.5 MG TABS    Sig: Take 1 tablet by mouth daily.    Dispense:  28 tablet    Refill:  11    Order Specific Question:   Supervising Provider    Answer:   Lazaro Arms [[id number]]     Follow-up: Return in about 3 months (around 07/04/2021) for med f/u.  Cheral Marker CNM, [facility] 04/03/2021 1:01 PM

## 2021-04-04 LAB — RPR: RPR Ser Ql: NONREACTIVE

## 2021-04-04 LAB — HIV ANTIBODY (ROUTINE TESTING W REFLEX): HIV Screen 4th Generation wRfx: NONREACTIVE

## 2021-04-06 LAB — CYTOLOGY - PAP
Chlamydia: NEGATIVE
Comment: NEGATIVE
Comment: NEGATIVE
Comment: NEGATIVE
Comment: NORMAL
HPV 16: NEGATIVE
HPV 18 / 45: NEGATIVE
High risk HPV: POSITIVE — AB
Neisseria Gonorrhea: NEGATIVE

## 2021-04-10 ENCOUNTER — Encounter: Payer: Self-pay | Admitting: Women's Health

## 2021-04-10 DIAGNOSIS — R87619 Unspecified abnormal cytological findings in specimens from cervix uteri: Secondary | ICD-10-CM | POA: Insufficient documentation

## 2021-04-11 ENCOUNTER — Telehealth: Payer: Self-pay | Admitting: Women's Health

## 2021-04-11 NOTE — Telephone Encounter (Signed)
[facility], sent MyChart message - need to schedule colposcopy with Joellyn Haff

## 2021-04-21 ENCOUNTER — Encounter: Payer: Self-pay | Admitting: Emergency Medicine

## 2021-04-21 ENCOUNTER — Other Ambulatory Visit: Payer: Self-pay

## 2021-04-21 ENCOUNTER — Ambulatory Visit
Admission: EM | Admit: 2021-04-21 | Discharge: 2021-04-21 | Disposition: A | Discharge status: Home / Self Care | Payer: 59 | Attending: Family Medicine | Admitting: Family Medicine

## 2021-04-21 DIAGNOSIS — R52 Pain, unspecified: Secondary | ICD-10-CM

## 2021-04-21 DIAGNOSIS — Z1152 Encounter for screening for COVID-19: Secondary | ICD-10-CM | POA: Diagnosis not present

## 2021-04-21 DIAGNOSIS — Z20822 Contact with and (suspected) exposure to covid-19: Secondary | ICD-10-CM

## 2021-04-21 DIAGNOSIS — R509 Fever, unspecified: Secondary | ICD-10-CM | POA: Diagnosis not present

## 2021-04-21 DIAGNOSIS — J069 Acute upper respiratory infection, unspecified: Secondary | ICD-10-CM | POA: Diagnosis not present

## 2021-04-21 MED ORDER — OSELTAMIVIR PHOSPHATE 75 MG PO CAPS: 75.0000 mg | ORAL_CAPSULE | Freq: Two times a day (BID) | ORAL | 0 refills | Status: DC

## 2021-04-21 MED ORDER — GUAIFENESIN ER 600 MG PO TB12: 600.0000 mg | ORAL_TABLET | Freq: Two times a day (BID) | ORAL | 0 refills | Status: DC | PRN

## 2021-04-21 MED ORDER — BENZONATATE 100 MG PO CAPS: 100.0000 mg | ORAL_CAPSULE | Freq: Three times a day (TID) | ORAL | 0 refills | Status: DC

## 2021-04-21 NOTE — ED Provider Notes (Signed)
[facility]    CSN: [id number] Arrival date & time: 04/21/21  4235      History   Chief Complaint No chief complaint on file.   HPI Elaine Lopez is a 30 y.o. female.   Patient presenting today with 2-day history of fever, chills, body aches, cough, sore throat, significant headache.  Denies chest pain, shortness of breath, abdominal pain, nausea vomiting and diarrhea.  No known history of pulmonary disease.  Multiple sick contacts recently but unsure with what.  So far taking over-the-counter pain relievers with minimal relief.  Was going to take some NyQuil but pharmacist told her that it would interact with some of her medications.   Past Medical History:  Diagnosis Date   Abnormal TSH 01/26/2019   Allergy    Chronic depression    Endometriosis    stage 4    History of drug abuse (HCC)    Lumbar herniated disc    x3   PTSD (post-traumatic stress disorder)    Vitamin D insufficiency 01/26/2019    Patient Active Problem List   Diagnosis Date Noted   Abnormal Pap smear of cervix 04/10/2021   Subclinical hyperthyroidism 11/27/2020   Generalized anxiety disorder 06/15/2020   Vitamin D insufficiency 01/26/2019   PTSD (post-traumatic stress disorder)    Chronic depression    History of drug abuse (HCC)    Endometriosis     Past Surgical History:  Procedure Laterality Date   laproscopy     x3   LUMBAR EPIDURAL INJECTION     TONSILLECTOMY AND ADENOIDECTOMY      OB History     Gravida  0   Para  0   Term  0   Preterm  0   AB  0   Living  0      SAB  0   IAB  0   Ectopic  0   Multiple  0   Live Births  0            Home Medications    Prior to Admission medications   Medication Sig Start Date End Date Taking? Authorizing Provider  benzonatate (TESSALON) 100 MG capsule Take 1 capsule (100 mg total) by mouth every 8 (eight) hours. 04/21/21  Yes Particia Nearing, PA-C  guaiFENesin (MUCINEX) 600 MG 12 hr tablet Take 1  tablet (600 mg total) by mouth 2 (two) times daily as needed. 04/21/21  Yes Particia Nearing, PA-C  oseltamivir (TAMIFLU) 75 MG capsule Take 1 capsule (75 mg total) by mouth every 12 (twelve) hours. 04/21/21  Yes Particia Nearing, PA-C  busPIRone (BUSPAR) 7.5 MG tablet Take 1 tablet (7.5 mg total) by mouth 2 (two) times daily. (NEEDS TO BE SEEN BEFORE NEXT REFILL) 02/27/21   Gwenlyn Fudge, FNP  citalopram (CELEXA) 40 MG tablet Take 1 tablet (40 mg total) by mouth daily. (NEEDS TO BE SEEN BEFORE NEXT REFILL) 02/27/21   Gwenlyn Fudge, FNP  gabapentin (NEURONTIN) 300 MG capsule Take 2 capsules (600 mg total) by mouth at bedtime. 11/17/20   Gwenlyn Fudge, FNP  hydrOXYzine (ATARAX/VISTARIL) 10 MG tablet Take 1 tablet (10 mg total) by mouth at bedtime. 11/17/20   Gwenlyn Fudge, FNP  Melatonin 10 MG CAPS Take 2 capsules by mouth at bedtime.     [provider]  Omeprazole 20 MG TBEC Take by mouth.    [provider]  propranolol (INDERAL) 20 MG tablet Take 1 tablet (20 mg total)  by mouth 2 (two) times daily. 11/17/20   Gwenlyn Fudge, FNP  Relugolix-Estradiol-Norethind (MYFEMBREE) 40-1-0.5 MG TABS Take 1 tablet by mouth daily. 04/03/21   Cheral Marker, CNM  VITAMIN D, CHOLECALCIFEROL, PO Take 1,000 Units by mouth daily.    [provider]    Family History Family History  Problem Relation Age of Onset   Thyroid disease Mother    Hypertension Mother    Depression Mother    Vascular Disease Mother    Depression Father    Cancer Sister        ewings sarcoma   Heart attack Maternal Grandfather    COPD Paternal Grandfather    Heart disease Paternal Grandfather     Social History Social History   Tobacco Use   Smoking status: Former    Packs/day: 0.10    Types: Cigarettes    Quit date: 2020    Years since quitting: 2.8   Smokeless tobacco: Never   Tobacco comments:    maybe one a day   Vaping Use   Vaping Use: Every day  Substance Use  Topics   Alcohol use: Not Currently   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     Allergies   Patient has no known allergies.   Review of Systems Review of Systems Per HPI  Physical Exam Triage Vital Signs ED Triage Vitals  Enc Vitals Group     BP 04/21/21 1208 118/78     Pulse Rate 04/21/21 1208 82     Resp 04/21/21 1208 18     Temp 04/21/21 1208 98.4 F (36.9 C)     Temp Source 04/21/21 1208 Oral     SpO2 04/21/21 1208 97 %     Weight --      Height --      Head Circumference --      Peak Flow --      Pain Score 04/21/21 1209 3     Pain Loc --      Pain Edu? --      Excl. in GC? --    No data found.  Updated Vital Signs BP 118/78 (BP Location: Right Arm)   Pulse 82   Temp 98.4 F (36.9 C) (Oral)   Resp 18   LMP 04/06/2021 (Approximate)   SpO2 97%   Visual Acuity Right Eye Distance:   Left Eye Distance:   Bilateral Distance:    Right Eye Near:   Left Eye Near:    Bilateral Near:     Physical Exam Vitals and nursing note reviewed.  Constitutional:      Appearance: Normal appearance. She is not ill-appearing.  HENT:     Head: Atraumatic.     Right Ear: Tympanic membrane normal.     Left Ear: Tympanic membrane normal.     Nose: Rhinorrhea present.     Mouth/Throat:     Mouth: Mucous membranes are moist.     Pharynx: Posterior oropharyngeal erythema present. No oropharyngeal exudate.  Eyes:     Extraocular Movements: Extraocular movements intact.     Conjunctiva/sclera: Conjunctivae normal.  Cardiovascular:     Rate and Rhythm: Normal rate and regular rhythm.     Heart sounds: Normal heart sounds.  Pulmonary:     Effort: Pulmonary effort is normal. No respiratory distress.     Breath sounds: Normal breath sounds. No wheezing or rales.  Abdominal:  General: Bowel sounds are normal. There is no distension.     Palpations: Abdomen is soft.     Tenderness: There is no abdominal  tenderness. There is no guarding.  Musculoskeletal:        General: Normal range of motion.     Cervical back: Normal range of motion and neck supple.  Skin:    General: Skin is warm and dry.  Neurological:     Mental Status: She is alert and oriented to person, place, and time.     Motor: No weakness.     Gait: Gait normal.  Psychiatric:        Mood and Affect: Mood normal.        Thought Content: Thought content normal.        Judgment: Judgment normal.     UC Treatments / Results  Labs (all labs ordered are listed, but only abnormal results are displayed) Labs Reviewed  COVID-19, FLU A+B NAA    EKG   Radiology No results found.  Procedures Procedures (including critical care time)  Medications Ordered in UC Medications - No data to display  Initial Impression / Assessment and Plan / UC Course  I have reviewed the triage vital signs and the nursing notes.  Pertinent labs & imaging results that were available during my care of the patient were reviewed by me and considered in my medical decision making (see chart for details).     Vital signs reassuring today, no acute distress on exam.  Suspect viral illness, likely influenza given community exposures and symptoms today.  COVID and flu testing pending, will start Tamiflu in the meantime in addition to Mucinex, Tessalon for symptomatic benefit.  Discussed supportive over-the-counter home care and return precautions.  Work note given.  Final Clinical Impressions(s) / UC Diagnoses   Final diagnoses:  Encounter for screening for COVID-19  Exposure to COVID-19 virus  Viral URI with cough  Fever, unspecified  Generalized body aches   Discharge Instructions   None    ED Prescriptions     Medication Sig Dispense Auth. Provider   guaiFENesin (MUCINEX) 600 MG 12 hr tablet Take 1 tablet (600 mg total) by mouth 2 (two) times daily as needed. 30 tablet Particia Nearing, [place]   benzonatate (TESSALON) 100 MG  capsule Take 1 capsule (100 mg total) by mouth every 8 (eight) hours. 21 capsule Particia Nearing, [place]   oseltamivir (TAMIFLU) 75 MG capsule Take 1 capsule (75 mg total) by mouth every 12 (twelve) hours. 10 capsule Particia Nearing, [place]      PDMP not reviewed this encounter.   Particia Nearing, [place] 04/21/21 1312

## 2021-04-21 NOTE — ED Triage Notes (Signed)
Fever, body aches, cough, sore throat with headache x 2 days

## 2021-04-22 LAB — COVID-19, FLU A+B NAA
Influenza A, NAA: NOT DETECTED
Influenza B, NAA: NOT DETECTED
SARS-CoV-2, NAA: DETECTED — AB

## 2021-04-29 ENCOUNTER — Other Ambulatory Visit: Payer: Self-pay | Admitting: Family Medicine

## 2021-04-29 DIAGNOSIS — E0590 Thyrotoxicosis, unspecified without thyrotoxic crisis or storm: Secondary | ICD-10-CM

## 2021-05-07 ENCOUNTER — Other Ambulatory Visit: Payer: Self-pay | Admitting: Family Medicine

## 2021-05-07 DIAGNOSIS — N809 Endometriosis, unspecified: Secondary | ICD-10-CM

## 2021-05-07 DIAGNOSIS — F411 Generalized anxiety disorder: Secondary | ICD-10-CM

## 2021-05-15 ENCOUNTER — Encounter: Payer: 59 | Admitting: Women's Health

## 2021-05-26 ENCOUNTER — Other Ambulatory Visit: Payer: Self-pay | Admitting: Family Medicine

## 2021-05-26 ENCOUNTER — Encounter: Payer: Self-pay | Admitting: Family Medicine

## 2021-05-26 DIAGNOSIS — F4310 Post-traumatic stress disorder, unspecified: Secondary | ICD-10-CM

## 2021-05-26 DIAGNOSIS — F411 Generalized anxiety disorder: Secondary | ICD-10-CM

## 2021-05-26 DIAGNOSIS — F32A Depression, unspecified: Secondary | ICD-10-CM

## 2021-05-26 NOTE — Telephone Encounter (Signed)
LMTCB TO SCHEDULE APPT FOR MED REFILL ?LETTER MAILED ?

## 2021-05-26 NOTE — Telephone Encounter (Signed)
Elaine Lopez. NTBS 30 days given 04/29/21

## 2021-05-30 ENCOUNTER — Ambulatory Visit (INDEPENDENT_AMBULATORY_CARE_PROVIDER_SITE_OTHER): Payer: 59 | Admitting: Women's Health

## 2021-05-30 ENCOUNTER — Other Ambulatory Visit: Payer: Self-pay

## 2021-05-30 ENCOUNTER — Other Ambulatory Visit (HOSPITAL_COMMUNITY)
Admission: RE | Admit: 2021-05-30 | Discharge: 2021-05-30 | Disposition: A | Discharge status: Home / Self Care | Payer: 59 | Source: Ambulatory Visit | Attending: Women's Health | Admitting: Women's Health

## 2021-05-30 ENCOUNTER — Encounter: Payer: Self-pay | Admitting: Women's Health

## 2021-05-30 VITALS — BP 108/67 | HR 79 | Ht 70.0 in | Wt 243.0 lb

## 2021-05-30 DIAGNOSIS — B977 Papillomavirus as the cause of diseases classified elsewhere: Secondary | ICD-10-CM

## 2021-05-30 DIAGNOSIS — R87612 Low grade squamous intraepithelial lesion on cytologic smear of cervix (LGSIL): Secondary | ICD-10-CM | POA: Diagnosis not present

## 2021-05-30 DIAGNOSIS — Z3202 Encounter for pregnancy test, result negative: Secondary | ICD-10-CM

## 2021-05-30 DIAGNOSIS — N809 Endometriosis, unspecified: Secondary | ICD-10-CM

## 2021-05-30 DIAGNOSIS — N898 Other specified noninflammatory disorders of vagina: Secondary | ICD-10-CM | POA: Diagnosis present

## 2021-05-30 LAB — POCT URINE PREGNANCY: Preg Test, Ur: NEGATIVE

## 2021-05-30 NOTE — Progress Notes (Signed)
° °  COLPOSCOPY PROCEDURE NOTE Patient name: Elaine Lopez MRN [id number]  Date of birth: April 17, 1991 Subjective Findings:   Elaine Lopez is a 30 y.o. G0P0000 Caucasian female being seen today for a colposcopy.  Hasn't started myfembree yet for endometriosis, plans to start. Increased vaginal d/c, sometimes clear, yellow right now. No itching/odor/irritation.  Indication: Abnormal pap on 04/03/21: LSIL w/ HRHPV positive: other (not 16, 18/45)  Prior cytology:  Date Result Procedure  2019 negative per pt report at office in [place] None              Patient's last menstrual period was 05/13/2021 (exact date). Contraception: abstinence Menopausal: no. Hysterectomy: no.   Smoker: former . Immunocompromised: no.   The risks and benefits were explained and informed consent was obtained, and written copy is in chart. Pertinent History Reviewed:   Reviewed past medical,surgical, social, obstetrical and family history.  Reviewed problem list, medications and allergies. Objective Findings & Procedure:   Vitals:   05/30/21 1356  BP: 108/67  Pulse: 79  Weight: 243 lb (110.2 kg)  Height: 5\' 10"  (1.778 m)  Body mass index is 34.87 kg/m.  Results for orders placed or performed in visit on 05/30/21 (from the past 24 hour(s))  POCT urine pregnancy   Collection Time: 05/30/21  2:49 PM  Result Value Ref Range   Preg Test, Ur Negative Negative     Time out was performed.  Speculum placed in the vagina, cervix fully visualized. SCJ: fully visualized. Cervix swabbed x 3 with acetic acid.  Acetowhitening present: Yes Cervix: no visible lesions, no mosaicism, no punctation, no abnormal vasculature, and acetowhite lesion(s) noted at 11 & 9 o'clock. Endocervical curettage performed, Cervical biopsies taken at 11 & 9 o'clock, and Hemostasis achieved with Monsel's solution. Vagina: vaginal colposcopy not performed Vulva: vulvar colposcopy not performed  Specimens: 3  Complications: none  Chaperone:  06/01/21    Colposcopic Impression & Plan:   Colposcopy findings consistent with LSIL Plan: Post biopsy instructions given, Will notify patient of results when back, and Will base plan of care on pathology results and ASCCP guidelines  Vaginal d/c> CV swab  Endometriosis> stage 4, dx previously, start Myfembree, f/u Faith Rogue  Return in about 3 months (around 08/28/2021) for med f/u, with me, in person.  08/30/2021 CNM, [facility] 05/30/2021 3:20 PM

## 2021-05-30 NOTE — Patient Instructions (Signed)
Colposcopy, Care After ?The following information offers guidance on how to care for yourself after your procedure. Your health care provider may also give you more specific instructions. If you have problems or questions, contact your health care provider. ?What can I expect after the procedure? ?If you had a colposcopy without a biopsy, you can expect to feel fine right away after your procedure. However, you may have some spotting of blood for a few days. You can return to your normal activities. ?If you had a colposcopy with a biopsy, it is common after the procedure to have: ?Soreness and mild pain. These may last for a few days. ?Mild vaginal bleeding or discharge that is dark-colored and grainy. This may last for a few days. The discharge may be caused by a liquid (solution) that was used during the procedure. You may need to wear a sanitary pad during this time. ?Spotting of blood for at least 48 hours after the procedure. ?Follow these instructions at home: ?Medicines ?Take over-the-counter and prescription medicines only as told by your health care provider. ?Talk with your health care provider about what type of over-the-counter pain medicines and prescription medicines you can start to take again. It is especially important to talk with your health care provider if you take blood thinners. ?Activity ?Avoid using douche products, using tampons, and having sex for at least 3 days after the procedure or for as long as told by your health care provider. ?Return to your normal activities as told by your health care provider. Ask your health care provider what activities are safe for you. ?General instructions ?Ask your health care provider if you may take baths, swim, or use a hot tub. You may take showers. ?If you use birth control (contraception), continue to use it. ?Keep all follow-up visits. This is important. ?Contact a health care provider if: ?You have a fever or chills. ?You faint or feel  light-headed. ?Get help right away if: ?You have heavy bleeding from your vagina or pass blood clots. Heavy bleeding is bleeding that soaks through a sanitary pad in less than 1 hour. ?You have vaginal discharge that is abnormal, is yellow in color, or smells bad. This could be a sign of infection. ?You have severe pain or cramps in your lower abdomen that do not go away with medicine. ?Summary ?If you had a colposcopy without a biopsy, you can expect to feel fine right away, but you may have some spotting of blood for a few days. You can return to your normal activities. ?If you had a colposcopy with a biopsy, it is common to have mild pain for a few days and spotting for 48 hours after the procedure. ?Avoid using douche products, using tampons, and having sex for at least 3 days after the procedure or for as long as told by your health care provider. ?Get help right away if you have heavy bleeding, severe pain, or signs of infection. ?This information is not intended to replace advice given to you by your health care provider. Make sure you discuss any questions you have with your health care provider. ?Document Revised: 10/30/2020 Document Reviewed: 10/30/2020 ?Elsevier Patient Education ? 2022 Elsevier Inc. ? ?

## 2021-05-31 ENCOUNTER — Encounter: Payer: Self-pay | Admitting: Family Medicine

## 2021-05-31 ENCOUNTER — Ambulatory Visit (INDEPENDENT_AMBULATORY_CARE_PROVIDER_SITE_OTHER): Payer: 59 | Admitting: Family Medicine

## 2021-05-31 DIAGNOSIS — E0590 Thyrotoxicosis, unspecified without thyrotoxic crisis or storm: Secondary | ICD-10-CM

## 2021-05-31 DIAGNOSIS — E559 Vitamin D deficiency, unspecified: Secondary | ICD-10-CM | POA: Diagnosis not present

## 2021-05-31 DIAGNOSIS — F32A Depression, unspecified: Secondary | ICD-10-CM | POA: Diagnosis not present

## 2021-05-31 DIAGNOSIS — F4310 Post-traumatic stress disorder, unspecified: Secondary | ICD-10-CM | POA: Diagnosis not present

## 2021-05-31 DIAGNOSIS — N809 Endometriosis, unspecified: Secondary | ICD-10-CM

## 2021-05-31 DIAGNOSIS — F411 Generalized anxiety disorder: Secondary | ICD-10-CM

## 2021-05-31 MED ORDER — PROPRANOLOL HCL 20 MG PO TABS: 20.0000 mg | ORAL_TABLET | Freq: Two times a day (BID) | ORAL | 2 refills | Status: DC

## 2021-05-31 MED ORDER — CITALOPRAM HYDROBROMIDE 40 MG PO TABS: 40.0000 mg | ORAL_TABLET | Freq: Every day | ORAL | 2 refills | Status: DC

## 2021-05-31 MED ORDER — GABAPENTIN 300 MG PO CAPS: 600.0000 mg | ORAL_CAPSULE | Freq: Every day | ORAL | 2 refills | Status: DC

## 2021-05-31 MED ORDER — HYDROXYZINE HCL 10 MG PO TABS: 10.0000 mg | ORAL_TABLET | Freq: Every day | ORAL | 2 refills | Status: DC

## 2021-05-31 MED ORDER — BUSPIRONE HCL 15 MG PO TABS: 15.0000 mg | ORAL_TABLET | Freq: Two times a day (BID) | ORAL | 2 refills | Status: DC

## 2021-05-31 NOTE — Progress Notes (Signed)
Virtual Visit via Telephone Note  I connected with Elaine Lopez on 05/31/21 at 9:46 AM by telephone and verified that I am speaking with the correct person using two identifiers. Elaine Lopez is currently located at home and nobody is currently with her during this visit. The provider, Gwenlyn Fudge, FNP is located in their office at time of visit.  I discussed the limitations, risks, security and privacy concerns of performing an evaluation and management service by telephone and the availability of in person appointments. I also discussed with the patient that there may be a patient responsible charge related to this service. The patient expressed understanding and agreed to proceed.  Subjective: PCP: Gwenlyn Fudge, FNP  Chief Complaint  Patient presents with   Medical Management of Chronic Issues   Anxiety/Depression/PTSD: Patient reports her anxiety has been pretty bad for the past couple of months causing chest discomfort and dissociation.  She is currently taking Celexa 40 mg daily, BuSpar 7.5 mg twice daily, and hydroxyzine 10 mg at bedtime.  She does feel like the BuSpar helped initially.  She has previously failed treatment with doxepin and Wellbutrin.  Depression screen [facility] 2/9 05/31/2021 04/03/2021 11/17/2020  Decreased Interest 1 1 3   Down, Depressed, Hopeless 1 1 3   PHQ - 2 Score 2 2 6   Altered sleeping 3 3 3   Tired, decreased energy 3 3 3   Change in appetite 0 3 0  Feeling bad or failure about yourself  0 0 3  Trouble concentrating 1 2 2   Moving slowly or fidgety/restless 3 0 3  Suicidal thoughts 0 0 2  PHQ-9 Score 12 13 22   Difficult doing work/chores Not difficult at all - Not difficult at all   GAD 7 : Generalized Anxiety Score 05/31/2021 04/03/2021 11/17/2020 06/15/2020  Nervous, Anxious, on Edge 3 2 3 2   Control/stop worrying 1 2 3 1   Worry too much - different things 2 2 3 2   Trouble relaxing 1 2 3 2   Restless 1 0 2 1  Easily annoyed or irritable 3 2 3  1   Afraid - awful might happen 3 0 2 2  Total GAD 7 Score 14 10 19 11   Anxiety Difficulty Somewhat difficult - Extremely difficult Somewhat difficult    Endometriosis: Patient is taking gabapentin 600 mg at bedtime.  Her OB/GYN recently prescribed Myfembree to help with pain as well.  Vitamin D deficiency: Patient is taking an over-the-counter vitamin D3 1,000 units daily.   ROS: Per HPI  Current Outpatient Medications:    busPIRone (BUSPAR) 7.5 MG tablet, Take 1 tablet (7.5 mg total) by mouth 2 (two) times daily. (NEEDS TO BE SEEN BEFORE NEXT REFILL), Disp: 60 tablet, Rfl: 0   citalopram (CELEXA) 40 MG tablet, Take 1 tablet (40 mg total) by mouth daily. (NEEDS TO BE SEEN BEFORE NEXT REFILL), Disp: 30 tablet, Rfl: 0   gabapentin (NEURONTIN) 300 MG capsule, Take 2 capsules (600 mg total) by mouth at bedtime. (NEEDS TO BE SEEN BEFORE NEXT REFILL), Disp: 60 capsule, Rfl: 0   hydrOXYzine (ATARAX/VISTARIL) 10 MG tablet, Take 1 tablet (10 mg total) by mouth at bedtime. (NEEDS TO BE SEEN BEFORE NEXT REFILL), Disp: 30 tablet, Rfl: 0   Melatonin 10 MG CAPS, Take 2 capsules by mouth at bedtime. , Disp: , Rfl:    Omeprazole 20 MG TBEC, Take by mouth., Disp: , Rfl:    propranolol (INDERAL) 20 MG tablet, Take 1 tablet (20 mg total) by mouth 2 (two)  times daily. (NEEDS TO BE SEEN BEFORE NEXT REFILL), Disp: 60 tablet, Rfl: 0   Relugolix-Estradiol-Norethind (MYFEMBREE) 40-1-0.5 MG TABS, Take 1 tablet by mouth daily. (Patient not taking: Reported on 05/30/2021), Disp: 28 tablet, Rfl: 11   VITAMIN D, CHOLECALCIFEROL, PO, Take 1,000 Units by mouth daily., Disp: , Rfl:   No Known Allergies Past Medical History:  Diagnosis Date   Abnormal TSH 01/26/2019   Allergy    Chronic depression    Endometriosis    stage 4    History of drug abuse (HCC)    Lumbar herniated disc    x3   PTSD (post-traumatic stress disorder)    Vitamin D insufficiency 01/26/2019    Observations/Objective: A&O  No respiratory  distress or wheezing audible over the phone Mood, judgement, and thought processes all WNL   Assessment and Plan: 1. Generalized anxiety disorder Uncontrolled.  BuSpar increased from 7.5 mg twice daily to 15 mg twice daily with directions to increase by 7.5 mg times the first week. - busPIRone (BUSPAR) 15 MG tablet; Take 1 tablet (15 mg total) by mouth 2 (two) times daily.  Dispense: 60 tablet; Refill: 2 - citalopram (CELEXA) 40 MG tablet; Take 1 tablet (40 mg total) by mouth daily.  Dispense: 30 tablet; Refill: 2 - hydrOXYzine (ATARAX) 10 MG tablet; Take 1 tablet (10 mg total) by mouth at bedtime.  Dispense: 30 tablet; Refill: 2  2. Chronic depression Well controlled on current regimen.  - citalopram (CELEXA) 40 MG tablet; Take 1 tablet (40 mg total) by mouth daily.  Dispense: 30 tablet; Refill: 2  3. PTSD (post-traumatic stress disorder) Well controlled on current regimen.  - citalopram (CELEXA) 40 MG tablet; Take 1 tablet (40 mg total) by mouth daily.  Dispense: 30 tablet; Refill: 2  4. Endometriosis Well controlled on current regimen. Patient to start Myfembree. - gabapentin (NEURONTIN) 300 MG capsule; Take 2 capsules (600 mg total) by mouth at bedtime.  Dispense: 60 capsule; Refill: 2  5. Subclinical hyperthyroidism - propranolol (INDERAL) 20 MG tablet; Take 1 tablet (20 mg total) by mouth 2 (two) times daily.  Dispense: 60 tablet; Refill: 2  6. Vitamin D insufficiency Continue OTC supplement.   Follow Up Instructions: Return in about 6 weeks (around 07/12/2021) for Anxiety.  I discussed the assessment and treatment plan with the patient. The patient was provided an opportunity to ask questions and all were answered. The patient agreed with the plan and demonstrated an understanding of the instructions.   The patient was advised to call back or seek an in-person evaluation if the symptoms worsen or if the condition fails to improve as anticipated.  The above assessment and  management plan was discussed with the patient. The patient verbalized understanding of and has agreed to the management plan. Patient is aware to call the clinic if symptoms persist or worsen. Patient is aware when to return to the clinic for a follow-up visit. Patient educated on when it is appropriate to go to the emergency department.   Time call ended: 10:00 AM  I provided 14 minutes of non-face-to-face time during this encounter.  Deliah Boston, MSN, APRN, FNP-C [facility] 05/31/21

## 2021-06-01 LAB — CERVICOVAGINAL ANCILLARY ONLY
Bacterial Vaginitis (gardnerella): NEGATIVE
Candida Glabrata: NEGATIVE
Candida Vaginitis: NEGATIVE
Chlamydia: NEGATIVE
Comment: NEGATIVE
Comment: NEGATIVE
Comment: NEGATIVE
Comment: NEGATIVE
Comment: NEGATIVE
Comment: NORMAL
Neisseria Gonorrhea: NEGATIVE
Trichomonas: NEGATIVE

## 2021-06-01 LAB — SURGICAL PATHOLOGY

## 2021-06-13 ENCOUNTER — Encounter: Payer: Self-pay | Admitting: Family Medicine

## 2021-06-13 ENCOUNTER — Ambulatory Visit (INDEPENDENT_AMBULATORY_CARE_PROVIDER_SITE_OTHER): Payer: 59 | Admitting: Family Medicine

## 2021-06-13 DIAGNOSIS — J40 Bronchitis, not specified as acute or chronic: Secondary | ICD-10-CM | POA: Diagnosis not present

## 2021-06-13 DIAGNOSIS — J329 Chronic sinusitis, unspecified: Secondary | ICD-10-CM

## 2021-06-13 DIAGNOSIS — J020 Streptococcal pharyngitis: Secondary | ICD-10-CM | POA: Diagnosis not present

## 2021-06-13 MED ORDER — CEFPROZIL 500 MG PO TABS: 500.0000 mg | ORAL_TABLET | Freq: Two times a day (BID) | ORAL | 0 refills | Status: DC

## 2021-06-13 NOTE — Progress Notes (Signed)
Subjective:    [name], female    DOB: 1991/04/25, 30 y.o.   MRN: [id number]   HPI: Elaine Lopez is a 30 y.o. female presenting for chest pain, thick green productive cough. Decreased appetite. Exhaustion and sore throat. Onset 4 days ago. Getting worse. Possible strep and Covid exposure. Has PND. Not dyspneic.    Depression screen [facility] 2/9 05/31/2021 04/03/2021 11/17/2020 06/15/2020 02/25/2020  Decreased Interest 1 1 3 1 1   Down, Depressed, Hopeless 1 1 3  0 0  PHQ - 2 Score 2 2 6 1 1   Altered sleeping 3 3 3 3 3   Tired, decreased energy 3 3 3 3 3   Change in appetite 0 3 0 2 0  Feeling bad or failure about yourself  0 0 3 1 0  Trouble concentrating 1 2 2 1 2   Moving slowly or fidgety/restless 3 0 3 0 0  Suicidal thoughts 0 0 2 0 0  PHQ-9 Score 12 13 22 11 9   Difficult doing work/chores Not difficult at all - Not difficult at all Somewhat difficult -     Relevant past medical, surgical, family and social history reviewed and updated as indicated.  Interim medical history since our last visit reviewed. Allergies and medications reviewed and updated.  ROS:  Review of Systems  Constitutional:  Negative for activity change, appetite change, chills and fever.  HENT:  Positive for congestion, postnasal drip, rhinorrhea and sinus pressure. Negative for ear discharge, ear pain, hearing loss, nosebleeds, sneezing and trouble swallowing.   Respiratory:  Negative for chest tightness and shortness of breath.   Cardiovascular:  Negative for chest pain and palpitations.  Skin:  Negative for rash.    Social History   Tobacco Use  Smoking Status Former   Packs/day: 0.10   Types: Cigarettes   Quit date: 2020   Years since quitting: 2.9  Smokeless Tobacco Never  Tobacco Comments   maybe one a day        Objective:     Wt Readings from Last 3 Encounters:  05/30/21 243 lb (110.2 kg)  04/03/21 245 lb (111.1 kg)  11/27/20 240 lb (108.9 kg)     Exam deferred.  Pt. Harboring due to COVID 19. Phone visit performed.   Assessment & Plan:   1. Sinobronchitis   2. Strep pharyngitis     Meds ordered this encounter  Medications   cefPROZIL (CEFZIL) 500 MG tablet    Sig: Take 1 tablet (500 mg total) by mouth 2 (two) times daily. For infection. Take all of this medication.    Dispense:  20 tablet    Refill:  0    No orders of the defined types were placed in this encounter.     Diagnoses and all orders for this visit:  Sinobronchitis  Strep pharyngitis  Other orders -     cefPROZIL (CEFZIL) 500 MG tablet; Take 1 tablet (500 mg total) by mouth 2 (two) times daily. For infection. Take all of this medication.    Virtual Visit via telephone Note  I discussed the limitations, risks, security and privacy concerns of performing an evaluation and management service by telephone and the availability of in person appointments. The patient was identified with two identifiers. Pt.expressed understanding and agreed to proceed. Pt. Is at home. Dr. is in his office.  Follow Up Instructions:   I discussed the assessment and treatment plan with the patient. The patient was provided an opportunity to ask questions  and all were answered. The patient agreed with the plan and demonstrated an understanding of the instructions.   The patient was advised to call back or seek an in-person evaluation if the symptoms worsen or if the condition fails to improve as anticipated.   Total minutes including chart review and phone contact time: 11   Follow up plan: Return if symptoms worsen or fail to improve.  Mechele Claude, MD [facility]

## 2021-06-14 ENCOUNTER — Telehealth: Payer: Self-pay | Admitting: Family Medicine

## 2021-06-14 MED ORDER — CEFPROZIL 500 MG PO TABS: 500.0000 mg | ORAL_TABLET | Freq: Two times a day (BID) | ORAL | 0 refills | Status: DC

## 2021-06-14 NOTE — Telephone Encounter (Signed)
Resent to pharmacy in [place] patient did not answer when called to let her know of change

## 2021-06-25 ENCOUNTER — Other Ambulatory Visit: Payer: Self-pay | Admitting: Family Medicine

## 2021-06-25 DIAGNOSIS — F411 Generalized anxiety disorder: Secondary | ICD-10-CM

## 2021-07-27 ENCOUNTER — Other Ambulatory Visit: Payer: Self-pay | Admitting: *Deleted

## 2021-07-27 DIAGNOSIS — E0590 Thyrotoxicosis, unspecified without thyrotoxic crisis or storm: Secondary | ICD-10-CM

## 2021-07-27 MED ORDER — PROPRANOLOL HCL 20 MG PO TABS: 20.0000 mg | ORAL_TABLET | Freq: Two times a day (BID) | ORAL | 0 refills | Status: DC

## 2021-08-24 ENCOUNTER — Other Ambulatory Visit: Payer: Self-pay | Admitting: Family Medicine

## 2021-08-24 DIAGNOSIS — F4310 Post-traumatic stress disorder, unspecified: Secondary | ICD-10-CM

## 2021-08-24 DIAGNOSIS — F411 Generalized anxiety disorder: Secondary | ICD-10-CM

## 2021-08-24 DIAGNOSIS — F32A Depression, unspecified: Secondary | ICD-10-CM

## 2021-09-07 ENCOUNTER — Other Ambulatory Visit: Payer: Self-pay

## 2021-09-07 ENCOUNTER — Encounter: Payer: Self-pay | Admitting: Obstetrics & Gynecology

## 2021-09-07 ENCOUNTER — Ambulatory Visit: Payer: 59 | Admitting: Obstetrics & Gynecology

## 2021-09-07 VITALS — BP 105/68 | HR 73 | Ht 70.0 in | Wt 248.4 lb

## 2021-09-07 DIAGNOSIS — F32A Depression, unspecified: Secondary | ICD-10-CM

## 2021-09-07 DIAGNOSIS — N809 Endometriosis, unspecified: Secondary | ICD-10-CM

## 2021-09-07 DIAGNOSIS — N926 Irregular menstruation, unspecified: Secondary | ICD-10-CM

## 2021-09-07 DIAGNOSIS — R102 Pelvic and perineal pain: Secondary | ICD-10-CM | POA: Diagnosis not present

## 2021-09-07 DIAGNOSIS — Z3009 Encounter for other general counseling and advice on contraception: Secondary | ICD-10-CM | POA: Diagnosis not present

## 2021-09-07 LAB — POCT URINE PREGNANCY: Preg Test, Ur: NEGATIVE

## 2021-09-07 NOTE — Progress Notes (Signed)
? ?GYN VISIT ?Patient name: Elaine Lopez MRN [id number]  Date of birth: [birth year] ?Chief Complaint:   ?Follow-up ? ?History of Present Illness:   ?Elaine Lopez is a 31 y.o. G0P0000  female being seen today for follow up regarding: ? ?-Endometriosis- last visit with Joellyn Haff- started on Myfembree ?In review, Stage 4 endometriosis diagnosed by surgery with placement of Mirena at that time. ?Mirena removed 04/03/2021 due to continued pelvic pain and AUB.  Initially, she took a break from medicine as she was curious to see what happened.  In January, she reported "horrible" period and then decided to start the medication.  She has not had a period since that time, only occasion bloody discharge.  The only side effect that she has noted is some change in her depression- she has f/u with PCP as she thinks this may be related to her thyroid.  For now, desires to continue with myfembree. ? ?Still having some right-sided pain, but improved.  Rates her symptoms 5/10.  Still using NSAIDs and heating pad. ? ?She does desire a pregnancy in the future, but not currently.  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 ? ? ?Patient's last menstrual period was 06/18/2021 (approximate). ? ? ?  05/31/2021  ?  9:49 AM 04/03/2021  ? 11:01 AM 11/17/2020  ? 10:24 AM 06/15/2020  ? 12:09 PM 02/25/2020  ?  4:34 PM  ?Depression screen PHQ 2/9  ?Decreased Interest 1 1 3 1 1   ?Down, Depressed, Hopeless 1 1 3  0 0  ?PHQ - 2 Score 2 2 6 1 1   ?Altered sleeping 3 3 3 3 3   ?Tired, decreased energy 3 3 3 3 3   ?Change in appetite 0 3 0 2 0  ?Feeling bad or failure about yourself  0 0 3 1 0  ?Trouble concentrating 1 2 2 1 2   ?Moving slowly or fidgety/restless 3 0 3 0 0  ?Suicidal thoughts 0 0 2 0 0  ?PHQ-9 Score 12 13 22 11 9   ?Difficult doing work/chores Not difficult at all  Not difficult at all Somewhat difficult   ? ? ? ?Review of Systems:   ?Pertinent items are noted in HPI ?Denies fever/chills, dizziness, headaches, visual disturbances,  fatigue, shortness of breath, chest pain. As mentioned pt struggles with chronic pelvic pain, pain with IC. ? ?Pertinent History Reviewed:  ?Reviewed past medical,surgical, social, obstetrical and family history.  ?Reviewed problem list, medications and allergies. ?Physical Assessment:  ? ?Vitals:  ? 09/07/21 1412  ?BP: 105/68  ?Pulse: 73  ?Weight: 248 lb 6.4 oz (112.7 kg)  ?Height: 5\' 10"  (1.778 m)  ?Body mass index is 35.64 kg/m?. ? ?     Physical Examination:  ? General appearance: alert, well appearing, and in no distress ? Psych: mood appropriate, normal affect ? Skin: warm & dry  ? Cardiovascular: normal heart rate noted ? Respiratory: normal respiratory effort, no distress ? Abdomen: soft, non-tender, no reproducible pain ? Pelvic: examination not indicated ? Extremities: no edema  ? ?Chaperone: N/A   ? ?Assessment & Plan:  ?1) Endometriosis, Pelvic pain ?-plan to continue with Myfembree ?-reviewed that this is not contraception- discussed options such as Phexxi, POPs, condoms ?-pregnancy test negative today ?-discussed that if she does conceive plan to strop Myfembree ?-based on history reviewed strong concern for infertility and need for referral to REI ?-Questions and concerns were addressed ?-desires PFPT as she had started this in - referral created ? ? ?Orders Placed This  Encounter  ?Procedures  ? POCT urine pregnancy  ? ? ?Return for October 2023 annual. ? ? ?Myna Hidalgo, DO ?Attending Obstetrician & Gynecologist, Faculty Practice ?Center for [employer], [facility] ? ? ? ?

## 2021-09-14 ENCOUNTER — Other Ambulatory Visit: Payer: Self-pay | Admitting: Family Medicine

## 2021-09-14 DIAGNOSIS — F411 Generalized anxiety disorder: Secondary | ICD-10-CM

## 2021-09-14 DIAGNOSIS — N809 Endometriosis, unspecified: Secondary | ICD-10-CM

## 2021-10-09 ENCOUNTER — Telehealth: Payer: Self-pay | Admitting: Family Medicine

## 2021-10-09 NOTE — Telephone Encounter (Signed)
Brayton El will have to make this decision. I do not see any controlled medication on current list.   ? ?Jannifer Rodney, FNP ? ?

## 2021-10-09 NOTE — Telephone Encounter (Signed)
Televisit is fine

## 2021-10-10 ENCOUNTER — Other Ambulatory Visit: Payer: Self-pay | Admitting: Family Medicine

## 2021-10-10 ENCOUNTER — Ambulatory Visit (INDEPENDENT_AMBULATORY_CARE_PROVIDER_SITE_OTHER): Payer: 59 | Admitting: Family Medicine

## 2021-10-10 ENCOUNTER — Telehealth: Payer: Self-pay | Admitting: Family Medicine

## 2021-10-10 DIAGNOSIS — E559 Vitamin D deficiency, unspecified: Secondary | ICD-10-CM | POA: Diagnosis not present

## 2021-10-10 DIAGNOSIS — F4310 Post-traumatic stress disorder, unspecified: Secondary | ICD-10-CM

## 2021-10-10 DIAGNOSIS — E0590 Thyrotoxicosis, unspecified without thyrotoxic crisis or storm: Secondary | ICD-10-CM

## 2021-10-10 DIAGNOSIS — N809 Endometriosis, unspecified: Secondary | ICD-10-CM

## 2021-10-10 DIAGNOSIS — E669 Obesity, unspecified: Secondary | ICD-10-CM

## 2021-10-10 DIAGNOSIS — F411 Generalized anxiety disorder: Secondary | ICD-10-CM

## 2021-10-10 DIAGNOSIS — G4719 Other hypersomnia: Secondary | ICD-10-CM | POA: Diagnosis not present

## 2021-10-10 DIAGNOSIS — F5101 Primary insomnia: Secondary | ICD-10-CM | POA: Diagnosis not present

## 2021-10-10 DIAGNOSIS — F32A Depression, unspecified: Secondary | ICD-10-CM

## 2021-10-10 MED ORDER — BELSOMRA 10 MG PO TABS: 10.0000 mg | ORAL_TABLET | Freq: Every evening | ORAL | 0 refills | Status: DC | PRN

## 2021-10-10 NOTE — Progress Notes (Signed)
? ?Virtual Visit via Telephone Note ? ?I connected with Elaine Lopez on 10/10/21 at 2:03 PM by telephone and verified that I am speaking with the correct person using two identifiers. Elaine Lopez is currently located at work and nobody is currently with her during this visit. The provider, Loman Brooklyn, FNP is located in their office at time of visit. ? ?I discussed the limitations, risks, security and privacy concerns of performing an evaluation and management service by telephone and the availability of in person appointments. I also discussed with the patient that there may be a patient responsible charge related to this service. The patient expressed understanding and agreed to proceed. ? ?Subjective: ?PCP: Loman Brooklyn, FNP ? ?Chief Complaint  ?Patient presents with  ? Insomnia  ? ?Patient reports she is really struggling with feeling tired and irritable. She feels like she never gets enough sleep as she wakes frequently and finds it difficult to stay asleep. She reports very mild occasional snoring, but no periods of apnea. She has been taking hydroxyzine at bedtime which is not helpful.  ? ?She is also concerned that she has continued to gain weight.  ? ? ?ROS: Per HPI ? ?Current Outpatient Medications:  ?  busPIRone (BUSPAR) 15 MG tablet, Take 1 tablet (15 mg total) by mouth 2 (two) times daily. (NEEDS TO BE SEEN BEFORE NEXT REFILL), Disp: 60 tablet, Rfl: 0 ?  citalopram (CELEXA) 40 MG tablet, Take 1 tablet (40 mg total) by mouth daily. (NEEDS TO BE SEEN BEFORE NEXT REFILL), Disp: 30 tablet, Rfl: 0 ?  gabapentin (NEURONTIN) 300 MG capsule, Take 2 capsules (600 mg total) by mouth at bedtime. (NEEDS TO BE SEEN BEFORE NEXT REFILL), Disp: 60 capsule, Rfl: 0 ?  hydrOXYzine (ATARAX) 10 MG tablet, Take 1 tablet (10 mg total) by mouth at bedtime. (NEEDS TO BE SEEN BEFORE NEXT REFILL), Disp: 30 tablet, Rfl: 0 ?  Melatonin 10 MG CAPS, Take 2 capsules by mouth at bedtime. , Disp: , Rfl:  ?  Omeprazole 20  MG TBEC, Take 20 mg by mouth daily., Disp: , Rfl:  ?  propranolol (INDERAL) 20 MG tablet, Take 1 tablet (20 mg total) by mouth 2 (two) times daily. (NEEDS TO BE SEEN BEFORE NEXT REFILL), Disp: 60 tablet, Rfl: 0 ?  Relugolix-Estradiol-Norethind (MYFEMBREE) 40-1-0.5 MG TABS, Take 1 tablet by mouth daily., Disp: 28 tablet, Rfl: 11 ?  VITAMIN D, CHOLECALCIFEROL, PO, Take 1,000 Units by mouth daily., Disp: , Rfl:  ? ?No Known Allergies ?Past Medical History:  ?Diagnosis Date  ? Abnormal TSH 01/26/2019  ? Allergy   ? Chronic depression   ? Endometriosis   ? stage 4   ? History of drug abuse ([place])   ? Lumbar herniated disc   ? x3  ? PTSD (post-traumatic stress disorder)   ? Vitamin D insufficiency 01/26/2019  ? ? ?Observations/Objective: ?A&O  ?No respiratory distress or wheezing audible over the phone ?Mood, judgement, and thought processes all WNL ? ?Assessment and Plan: ?1. Primary insomnia ?Uncontrolled. Started Belsomra. ?- Suvorexant (BELSOMRA) 10 MG TABS; Take 10 mg by mouth at bedtime as needed.  Dispense: 30 tablet; Refill: 0 ? ?2. Excessive daytime sleepiness ?Labs to assess for cause.  ?- CMP14+EGFR; Future ?- Anemia Profile B; Future ?- TSH; Future ?- VITAMIN D 25 Hydroxy (Vit-D Deficiency, Fractures); Future ? ?3. Subclinical hyperthyroidism ?- TSH; Future ?- propranolol (INDERAL) 20 MG tablet; Take 1 tablet (20 mg total) by mouth 2 (two) times daily.  Dispense: 180 tablet; Refill: 1 ? ?4. Vitamin D insufficiency ?- VITAMIN D 25 Hydroxy (Vit-D Deficiency, Fractures); Future ? ?5. Generalized anxiety disorder ?Well controlled on current regimen.  ?- CMP14+EGFR; Future ?- busPIRone (BUSPAR) 15 MG tablet; Take 1 tablet (15 mg total) by mouth 2 (two) times daily.  Dispense: 180 tablet; Refill: 1 ?- citalopram (CELEXA) 40 MG tablet; Take 1 tablet (40 mg total) by mouth daily.  Dispense: 90 tablet; Refill: 1 ?- hydrOXYzine (ATARAX) 10 MG tablet; Take 1 tablet (10 mg total) by mouth at bedtime.  Dispense: 90 tablet;  Refill: 1 ? ?6. Chronic depression ?Well controlled on current regimen.  ?- CMP14+EGFR; Future ?- citalopram (CELEXA) 40 MG tablet; Take 1 tablet (40 mg total) by mouth daily.  Dispense: 90 tablet; Refill: 1 ? ?7. PTSD (post-traumatic stress disorder) ?Well controlled on current regimen.  ?- CMP14+EGFR; Future ?- citalopram (CELEXA) 40 MG tablet; Take 1 tablet (40 mg total) by mouth daily.  Dispense: 90 tablet; Refill: 1 ? ?8. Endometriosis ?Well controlled on current regimen.  ?- gabapentin (NEURONTIN) 300 MG capsule; Take 2 capsules (600 mg total) by mouth at bedtime.  Dispense: 180 capsule; Refill: 1 ? ?9. Obesity (BMI 30-39.9) ?Encouraged patient to check on [email or web address] to see if her insurance covers [place] or [place]. Advised if they do, she can send me a MyChart message or call the office and I will prescribe.  ? ? ?Follow Up Instructions: ?Return in about 4 weeks (around 11/07/2021) for Sleep. ? ?I discussed the assessment and treatment plan with the patient. The patient was provided an opportunity to ask questions and all were answered. The patient agreed with the plan and demonstrated an understanding of the instructions. ?  ?The patient was advised to call back or seek an in-person evaluation if the symptoms worsen or if the condition fails to improve as anticipated. ? ?The above assessment and management plan was discussed with the patient. The patient verbalized understanding of and has agreed to the management plan. Patient is aware to call the clinic if symptoms persist or worsen. Patient is aware when to return to the clinic for a follow-up visit. Patient educated on when it is appropriate to go to the emergency department.  ? ?Time call ended: 2:14 PM ? ?I provided 11 minutes of non-face-to-face time during this encounter. ? ?Hendricks Limes, MSN, APRN, FNP-C ?[place] ?10/10/21 ? ? ?

## 2021-10-10 NOTE — Telephone Encounter (Signed)
Left detailed message to call back and let us know she would like visit changed to tele visit.  ?

## 2021-10-10 NOTE — Telephone Encounter (Signed)
Appointment changed

## 2021-10-10 NOTE — Patient Instructions (Signed)
Here is a guide to help us find out which weight loss medications will be covered by your insurance plan.  Please check out this web site  [email or web address] and follow the 3 simple steps.   There is also a phone number you can call if you do not have access to the Internet. [phone] (Monday- Friday 8am-8pm)  [employer] provides coverage information for more than 80% of the inquiries submitted!!  

## 2021-10-11 MED ORDER — HYDROXYZINE HCL 10 MG PO TABS: 10.0000 mg | ORAL_TABLET | Freq: Every day | ORAL | 1 refills | Status: DC

## 2021-10-11 MED ORDER — PROPRANOLOL HCL 20 MG PO TABS: 20.0000 mg | ORAL_TABLET | Freq: Two times a day (BID) | ORAL | 1 refills | Status: DC

## 2021-10-11 MED ORDER — GABAPENTIN 300 MG PO CAPS: 600.0000 mg | ORAL_CAPSULE | Freq: Every day | ORAL | 1 refills | Status: DC

## 2021-10-11 MED ORDER — BUSPIRONE HCL 15 MG PO TABS: 15.0000 mg | ORAL_TABLET | Freq: Two times a day (BID) | ORAL | 1 refills | Status: DC

## 2021-10-11 MED ORDER — CITALOPRAM HYDROBROMIDE 40 MG PO TABS: 40.0000 mg | ORAL_TABLET | Freq: Every day | ORAL | 1 refills | Status: DC

## 2021-10-11 NOTE — Telephone Encounter (Signed)
Refills sent

## 2021-10-16 ENCOUNTER — Encounter: Payer: Self-pay | Admitting: Family Medicine

## 2021-10-16 ENCOUNTER — Telehealth: Payer: Self-pay

## 2021-10-16 DIAGNOSIS — F5101 Primary insomnia: Secondary | ICD-10-CM

## 2021-10-16 NOTE — Telephone Encounter (Signed)
Belsomra 10MG  tablets ? ? ?Key: - PA Case ID: [id number] - Rx #: [id number] ? ?Sent to [facility] ? ?

## 2021-10-17 ENCOUNTER — Encounter: Payer: Self-pay | Admitting: Family Medicine

## 2021-10-19 MED ORDER — ESZOPICLONE 1 MG PO TABS: 1.0000 mg | ORAL_TABLET | Freq: Every evening | ORAL | 0 refills | Status: DC | PRN

## 2021-10-19 NOTE — Addendum Note (Signed)
Addended by: Deliah Boston F on: 10/19/2021 11:39 AM ? ? Modules accepted: Orders ? ?

## 2021-10-19 NOTE — Telephone Encounter (Signed)
Please make patient aware Belsomra was not covered by her insurance.  They have given their preference list and I have sent in Lunesta which was on their list. ?

## 2021-10-19 NOTE — Telephone Encounter (Signed)
[name]: [place] Case ID: [id number] - Rx #: [id number] Need help? Call us at [phone] ?Outcome ?Deniedon May 3 ?PA Case: [id number], Status: Denied, Denial Rationale: The plan medication coverage guidelines require that the patient attempt and fail treatment with prerequisite medication(s) prior to initiating treatment with the requested medication. Please utilize the plan formulary for additional information on prerequisite options. Coverage for Belsomra 10MG Tablet is denied. It does not meet step therapy requirements. The information provided by your prescriber does not meet [employer]'s guideline. The guideline used is: Insomnia Agents Step Therapy. Information provided does not show that the policy requirements have been met. The requirement(s) not met are: You have not tried and failed 1 of the following step therapy drugs for treatment of your condition. A generic non-benzodiazepine hypnotic agents: zolpidem, zaleplon, eszopiclone You do not have to try a medication if your prescriber sends [place] reasons why the medication choices are not a good fit for you. These reasons might be because you are allergic to the medication or have had a serious reaction to the medication in the past. Other reasons could be that the medication does not work well with another medication you are taking, or it may not be a good choice because of a medical diagnosis. Therefore, coverage for Belsomra 10MG Tablet is denied. Questions? Contact [phone]. ?Drug ?Belsomra 10MG tablets ?Form ?Production designer, theatre/television/film Prior Authorization Form [id number] [employer]) ?[place] ?608 ?

## 2021-10-19 NOTE — Telephone Encounter (Signed)
Lmtcb.

## 2021-10-23 NOTE — Telephone Encounter (Signed)
PT can try taking melatonin with her Elaine Lopez and to see if this helps. Keep follow up with PCP.  ?

## 2021-10-23 NOTE — Telephone Encounter (Signed)
Patient aware and states she has took the [place] for a few days. It is helping her stay asleep but it is not helping her fall asleep.  She asked the pharmacist if she should still take the hydroxyzine & melatonin with medication and they advised her not to.   Would like to know if she can take both or either medication to help her get to sleep.   ? ?PCP- OFF ?Will send to back up provider  ?

## 2021-10-23 NOTE — Telephone Encounter (Signed)
L/m to RC

## 2021-10-23 NOTE — Telephone Encounter (Signed)
Lmtcb.

## 2021-10-26 NOTE — Telephone Encounter (Signed)
Patient aware.

## 2021-10-30 ENCOUNTER — Other Ambulatory Visit: Payer: Self-pay | Admitting: Family Medicine

## 2021-10-31 LAB — CMP14+EGFR
ALT: 17 IU/L (ref 0–32)
AST: 16 IU/L (ref 0–40)
Albumin/Globulin Ratio: 1.9 (ref 1.2–2.2)
Albumin: 4.8 g/dL (ref 3.9–5.0)
Alkaline Phosphatase: 98 IU/L (ref 44–121)
BUN/Creatinine Ratio: 20 (ref 9–23)
BUN: 15 mg/dL (ref 6–20)
Bilirubin Total: <0.2 mg/dL (ref 0.0–1.2)
CO2: 23 mmol/L (ref 20–29)
Calcium: 10.1 mg/dL (ref 8.7–10.2)
Chloride: 103 mmol/L (ref 96–106)
Creatinine, Ser: 0.75 mg/dL (ref 0.57–1.00)
Globulin, Total: 2.5 g/dL (ref 1.5–4.5)
Glucose: 85 mg/dL (ref 70–99)
Potassium: 4.3 mmol/L (ref 3.5–5.2)
Sodium: 138 mmol/L (ref 134–144)
Total Protein: 7.3 g/dL (ref 6.0–8.5)
eGFR: 110 mL/min/{1.73_m2} (ref >59)

## 2021-10-31 LAB — ANEMIA PROFILE B
Basophils Absolute: 0.1 10*3/uL (ref 0.0–0.2)
Basos: 1 %
EOS (ABSOLUTE): 0.2 10*3/uL (ref 0.0–0.4)
Eos: 2 %
Ferritin: 26 ng/mL (ref 15–150)
Folate: 9.5 ng/mL (ref >3.0)
Hematocrit: 38.1 % (ref 34.0–46.6)
Hemoglobin: 13.2 g/dL (ref 11.1–15.9)
Immature Grans (Abs): 0 10*3/uL (ref 0.0–0.1)
Immature Granulocytes: 0 %
Iron Saturation: 13 % — ABNORMAL LOW (ref 15–55)
Iron: 46 ug/dL (ref 27–159)
Lymphocytes Absolute: 3.3 10*3/uL — ABNORMAL HIGH (ref 0.7–3.1)
Lymphs: 32 %
MCH: 29.7 pg (ref 26.6–33.0)
MCHC: 34.6 g/dL (ref 31.5–35.7)
MCV: 86 fL (ref 79–97)
Monocytes Absolute: 0.7 10*3/uL (ref 0.1–0.9)
Monocytes: 7 %
Neutrophils Absolute: 5.8 10*3/uL (ref 1.4–7.0)
Neutrophils: 58 %
Platelets: 317 10*3/uL (ref 150–450)
RBC: 4.44 x10E6/uL (ref 3.77–5.28)
RDW: 12.7 % (ref 11.7–15.4)
Retic Ct Pct: 1.5 % (ref 0.6–2.6)
Total Iron Binding Capacity: 358 ug/dL (ref 250–450)
UIBC: 312 ug/dL (ref 131–425)
Vitamin B-12: 649 pg/mL (ref 232–1245)
WBC: 10 10*3/uL (ref 3.4–10.8)

## 2021-10-31 LAB — TSH: TSH: 1.91 u[IU]/mL (ref 0.450–4.500)

## 2021-10-31 LAB — VITAMIN D 25 HYDROXY (VIT D DEFICIENCY, FRACTURES): Vit D, 25-Hydroxy: 32.8 ng/mL (ref 30.0–100.0)

## 2021-11-07 ENCOUNTER — Encounter: Payer: Self-pay | Admitting: Family Medicine

## 2021-11-07 ENCOUNTER — Ambulatory Visit (INDEPENDENT_AMBULATORY_CARE_PROVIDER_SITE_OTHER): Payer: 59 | Admitting: Family Medicine

## 2021-11-07 VITALS — BP 109/71 | HR 64 | Temp 98.0°F | Ht 70.0 in | Wt 245.2 lb

## 2021-11-07 DIAGNOSIS — E669 Obesity, unspecified: Secondary | ICD-10-CM | POA: Diagnosis not present

## 2021-11-07 DIAGNOSIS — F5101 Primary insomnia: Secondary | ICD-10-CM

## 2021-11-07 DIAGNOSIS — M549 Dorsalgia, unspecified: Secondary | ICD-10-CM

## 2021-11-07 MED ORDER — NAPROXEN 500 MG PO TABS: 500.0000 mg | ORAL_TABLET | Freq: Two times a day (BID) | ORAL | 0 refills | Status: DC

## 2021-11-07 MED ORDER — METHOCARBAMOL 500 MG PO TABS: 500.0000 mg | ORAL_TABLET | Freq: Three times a day (TID) | ORAL | 1 refills | Status: DC | PRN

## 2021-11-07 MED ORDER — ESZOPICLONE 2 MG PO TABS: 2.0000 mg | ORAL_TABLET | Freq: Every evening | ORAL | 0 refills | Status: DC | PRN

## 2021-11-07 MED ORDER — PHENTERMINE HCL 37.5 MG PO TABS: 37.5000 mg | ORAL_TABLET | Freq: Every day | ORAL | 0 refills | Status: DC

## 2021-11-07 NOTE — Progress Notes (Signed)
Assessment & Plan:  1. Primary insomnia Improving.  Lunesta increased from 1 mg to 2 mg nightly. - eszopiclone (LUNESTA) 2 MG TABS tablet; Take 1 tablet (2 mg total) by mouth at bedtime as needed for sleep. Take immediately before bedtime  Dispense: 30 tablet; Refill: 0  2. Musculoskeletal back pain - methocarbamol (ROBAXIN) 500 MG tablet; Take 1 tablet (500 mg total) by mouth every 8 (eight) hours as needed for muscle spasms.  Dispense: 60 tablet; Refill: 1 - naproxen (NAPROSYN) 500 MG tablet; Take 1 tablet (500 mg total) by mouth 2 (two) times daily with a meal.  Dispense: 30 tablet; Refill: 0  3. Obesity (BMI 30-39.9) Encouraged healthy eating and exercise.  Adipex prescription #1 prescribed today. Starting weight today in May 2023: 245 lbs - phentermine (ADIPEX-P) 37.5 MG tablet; Take 1 tablet (37.5 mg total) by mouth daily before breakfast.  Dispense: 30 tablet; Refill: 0   Return in about 4 weeks (around 12/05/2021) for Weight & sleep.  Hendricks Limes, MSN, APRN, FNP-C [facility]  Subjective:    [name], female    DOB: [birth year], 31 y.o.   MRN: [id number]  Patient Care Team: Loman Brooklyn, FNP as PCP - General (Family Medicine) Dr. Hoyle Barr as Consulting Physician (Psychiatry) [place] as Consulting Physician   Chief Complaint:  Chief Complaint  Patient presents with   sleep problems    4 week follow up. Patient states that she is sleeping better though the night but is having trouble falling asleep.    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    Weight Loss    Options - wegovy is not covered     HPI: Elaine Lopez is a 31 y.o. female presenting on 11/07/2021 for sleep problems (4 week follow up. Patient states that she is sleeping better though the night but is having trouble falling asleep. ), Back Pain (Patient states on Friday  she was moving a resident at work and since then she has been having lower back pain. ), and Weight Loss (Options - wegovy is not covered )  Insomnia: Patient was started on Lunesta 4 weeks ago.  States she is sleeping really well throughout the night and not waking frequently, but she is still having trouble falling asleep.  Prior to taking Lunesta that she was taking hydroxyzine to help her get to sleep, which was not very effective.  She was also taking melatonin 15 mg, which she decreased to 5 mg when she started taking the Lunesta at the recommendation of her pharmacist.  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  Pain is worse on the right side.  She has tried Tylenol, ibuprofen, ice, and Biofreeze, which have not been very helpful.  Obesity: Patient has previously gone to [email or web address] to see what weight loss medication her insurance covers, and they do not cover one.  She has been trying to make healthier choices for breakfast and lunch, but does not eat as well for dinner since it is just her at home.  She is active and trying to get more exercise in daily.   Social history:  Relevant past medical, surgical, family and social history reviewed and updated as indicated. Interim medical history since our last visit reviewed.  Allergies  and medications reviewed and updated.  DATA REVIEWED: CHART IN EPIC  ROS: Negative unless specifically indicated above in HPI.    Current Outpatient Medications:    busPIRone (BUSPAR) 15 MG tablet, Take 1 tablet (15 mg total) by mouth 2 (two) times daily., Disp: 180 tablet, Rfl: 1   citalopram (CELEXA) 40 MG tablet, Take 1 tablet (40 mg total) by mouth daily., Disp: 90 tablet, Rfl: 1   eszopiclone (LUNESTA) 1 MG TABS tablet, Take 1 tablet (1 mg total) by mouth at bedtime as needed for sleep. Take immediately before bedtime, Disp: 30 tablet, Rfl: 0   gabapentin (NEURONTIN) 300  MG capsule, Take 2 capsules (600 mg total) by mouth at bedtime., Disp: 180 capsule, Rfl: 1   hydrOXYzine (ATARAX) 10 MG tablet, Take 1 tablet (10 mg total) by mouth at bedtime., Disp: 90 tablet, Rfl: 1   Melatonin 10 MG CAPS, Take 2 capsules by mouth at bedtime. , Disp: , Rfl:    Omeprazole 20 MG TBEC, Take 20 mg by mouth daily., Disp: , Rfl:    propranolol (INDERAL) 20 MG tablet, Take 1 tablet (20 mg total) by mouth 2 (two) times daily., Disp: 180 tablet, Rfl: 1   Relugolix-Estradiol-Norethind (MYFEMBREE) 40-1-0.5 MG TABS, Take 1 tablet by mouth daily., Disp: 28 tablet, Rfl: 11   VITAMIN D, CHOLECALCIFEROL, PO, Take 1,000 Units by mouth daily., Disp: , Rfl:    No Known Allergies Past Medical History:  Diagnosis Date   Abnormal TSH 01/26/2019   Allergy    Chronic depression    Endometriosis    stage 4    History of drug abuse ([facility])    Lumbar herniated disc    x3   PTSD (post-traumatic stress disorder)    Vitamin D insufficiency 01/26/2019    Past Surgical History:  Procedure Laterality Date   laproscopy     x3   LUMBAR EPIDURAL INJECTION     TONSILLECTOMY AND ADENOIDECTOMY      Social History   Socioeconomic History   Marital status: Single    Spouse name: Not on file   Number of children: 0   Years of education: Not on file   Highest education level: Not on file  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   Sexual activity: Yes    Birth control/protection: Pill  Other Topics Concern   Not on file  Social History Narrative   Not on file   Social Determinants of Health    Financial Resource Strain: Medium Risk   Difficulty of Paying Living Expenses: Somewhat hard  Food Insecurity: No Food Insecurity   Worried About Charity fundraiser in the Last Year: Never true   Ran Out of Food in the Last Year: Never true  Transportation Needs: No Transportation Needs   Lack of Transportation (Medical): No   Lack of Transportation (Non-Medical): No  Physical Activity: Insufficiently Active   Days of Exercise per Week: 5 days   Minutes  of Exercise per Session: 10 min  Stress: Stress Concern Present   Feeling of Stress : Rather much  Social Connections: Socially Isolated   Frequency of Communication with Friends and Family: More than three times a week   Frequency of Social Gatherings with Friends and Family: Once a week   Attends Religious Services: Never   Marine scientist or Organizations: No   Attends Music therapist: Never   Marital Status: Never married  Human resources officer Violence: Not At Risk   Fear of Current or Ex-Partner: No   Emotionally Abused: No   Physically Abused: No   Sexually Abused: No        Objective:    BP 109/71   Pulse 64   Temp 98 F (36.7 C) (Temporal)   Ht '5\' 10"'  (1.778 m)   Wt 245 lb 3.2 oz (111.2 kg)   SpO2 98%   BMI 35.18 kg/m   Wt Readings from Last 3 Encounters:  11/07/21 245 lb 3.2 oz (111.2 kg)  09/07/21 248 lb 6.4 oz (112.7 kg)  05/30/21 243 lb (110.2 kg)    Physical Exam Vitals reviewed.  Constitutional:      General: She is not in acute distress.    Appearance: Normal appearance. She is obese. She is not ill-appearing, toxic-appearing or diaphoretic.  HENT:     Head: Normocephalic and atraumatic.  Eyes:     General: No scleral icterus.       Right eye: No discharge.        Left eye: No discharge.     Conjunctiva/sclera: Conjunctivae normal.  Cardiovascular:     Rate and Rhythm: Normal rate and regular rhythm.     Heart sounds: Normal heart sounds. No murmur heard.   No friction  rub. No gallop.  Pulmonary:     Effort: Pulmonary effort is normal. No respiratory distress.     Breath sounds: Normal breath sounds. No stridor. No wheezing, rhonchi or rales.  Musculoskeletal:        General: Normal range of motion.     Cervical back: Normal range of motion.  Skin:    General: Skin is warm and dry.     Capillary Refill: Capillary refill takes less than 2 seconds.  Neurological:     General: No focal deficit present.     Mental Status: She is alert and oriented to person, place, and time. Mental status is at baseline.  Psychiatric:        Mood and Affect: Mood normal.        Behavior: Behavior normal.        Thought Content: Thought content normal.        Judgment: Judgment normal.    Lab Results  Component Value Date   TSH 1.910 10/30/2021   Lab Results  Component Value Date   WBC 10.0 10/30/2021   HGB 13.2 10/30/2021   HCT 38.1 10/30/2021   MCV 86 10/30/2021   PLT 317 10/30/2021   Lab Results  Component Value Date   NA 138 10/30/2021   K 4.3 10/30/2021   CO2 23 10/30/2021   GLUCOSE 85 10/30/2021   BUN 15 10/30/2021   CREATININE 0.75 10/30/2021   BILITOT <0.2 10/30/2021   ALKPHOS 98 10/30/2021   AST 16 10/30/2021   ALT 17 10/30/2021   PROT 7.3 10/30/2021   ALBUMIN 4.8 10/30/2021   CALCIUM 10.1 10/30/2021   EGFR 110 10/30/2021   Lab Results  Component Value Date  CHOL 153 02/25/2020   Lab Results  Component Value Date   HDL 33 (L) 02/25/2020   Lab Results  Component Value Date   LDLCALC 93 02/25/2020   Lab Results  Component Value Date   TRIG 154 (H) 02/25/2020   Lab Results  Component Value Date   CHOLHDL 4.6 (H) 02/25/2020   No results found for: HGBA1C

## 2021-11-13 ENCOUNTER — Encounter: Payer: Self-pay | Admitting: Family Medicine

## 2021-11-14 ENCOUNTER — Telehealth: Payer: Self-pay | Admitting: Women's Health

## 2021-11-14 NOTE — Telephone Encounter (Signed)
Pt to come and pick up Myfembree samples. Lot # [id number] exp 03/17/2022. No discount cards available. Pt advised to have pharmacy send PA info so we can get started on that. Pt voiced understanding. JSY

## 2021-11-14 NOTE — Telephone Encounter (Signed)
Patient's insurance needs prior authorization for myfembree. She wanted to know if we had any more discount cards and if we had any samples here she could get. Please advise.

## 2021-11-15 ENCOUNTER — Telehealth: Payer: Self-pay | Admitting: *Deleted

## 2021-11-15 ENCOUNTER — Encounter: Payer: Self-pay | Admitting: *Deleted

## 2021-11-15 MED ORDER — MYFEMBREE 40-1-0.5 MG PO TABS
1.0000 | ORAL_TABLET | Freq: Every day | ORAL | 11 refills | Status: DC
Start: 2021-11-15 — End: 2022-03-01

## 2021-11-15 NOTE — Telephone Encounter (Signed)
Pt's insurance has denied covering Myfembree. Please advise. Thanks! [place]

## 2021-11-20 ENCOUNTER — Telehealth: Payer: Self-pay

## 2021-11-20 NOTE — Telephone Encounter (Signed)
Notice of PA closure per [employer]  Drug- phentermine  The [employer] PA team is unable to review this product for a coverage determination as the requested medication is not on the patients formulary. Please reach out to [employer] directly for this request.

## 2021-11-21 NOTE — Telephone Encounter (Signed)
PT R/C 

## 2021-11-21 NOTE — Telephone Encounter (Signed)
Tell patient to pay out-of-pocket. Cheap medication.

## 2021-11-21 NOTE — Telephone Encounter (Signed)
Lmtcb   When patient calls back -  Tell patient to pay out-of-pocket. Cheap medication.

## 2021-11-21 NOTE — Telephone Encounter (Signed)
Lmtcb.

## 2021-11-21 NOTE — Telephone Encounter (Signed)
Pt returned missed call. Made pt aware of providers advise. Pt voiced understanding.

## 2021-12-05 ENCOUNTER — Ambulatory Visit (INDEPENDENT_AMBULATORY_CARE_PROVIDER_SITE_OTHER): Payer: 59 | Admitting: Family Medicine

## 2021-12-05 ENCOUNTER — Other Ambulatory Visit: Payer: Self-pay | Admitting: Family Medicine

## 2021-12-05 ENCOUNTER — Encounter: Payer: Self-pay | Admitting: Family Medicine

## 2021-12-05 VITALS — BP 118/82 | HR 72 | Temp 97.9°F | Ht 70.0 in | Wt 238.0 lb

## 2021-12-05 DIAGNOSIS — E669 Obesity, unspecified: Secondary | ICD-10-CM | POA: Diagnosis not present

## 2021-12-05 DIAGNOSIS — F5101 Primary insomnia: Secondary | ICD-10-CM

## 2021-12-05 MED ORDER — PHENTERMINE HCL 37.5 MG PO TABS: 37.5000 mg | ORAL_TABLET | Freq: Every day | ORAL | 0 refills | Status: DC

## 2021-12-05 MED ORDER — TEMAZEPAM 7.5 MG PO CAPS: 7.5000 mg | ORAL_CAPSULE | Freq: Every evening | ORAL | 0 refills | Status: DC | PRN

## 2021-12-05 NOTE — Progress Notes (Signed)
Assessment & Plan:  1. Obesity (BMI 30-39.9) Encouraged healthy eating and exercise.  Adipex prescription #2 prescribed today. Starting weight today in May 2023: 245 lbs Today's weight: 238 lbs Weight loss in the past month: 7 lbs - phentermine (ADIPEX-P) 37.5 MG tablet; Take 1 tablet (37.5 mg total) by mouth daily before breakfast.  Dispense: 30 tablet; Refill: 0  2. Primary insomnia Uncontrolled. Start temazepam. D/C Lunesta. - temazepam (RESTORIL) 7.5 MG capsule; Take 1 capsule (7.5 mg total) by mouth at bedtime as needed for sleep.  Dispense: 30 capsule; Refill: 0   Return in about 4 weeks (around 01/02/2022) for Sleep & weight.  Hendricks Limes, MSN, APRN, FNP-C [facility]  Subjective:    [name], female    DOB: May 15, 1991, 31 y.o.   MRN: [id number]  Patient Care Team: Loman Brooklyn, FNP as PCP - General (Family Medicine) Dr. Hoyle Barr as Consulting Physician (Psychiatry) [place] as Consulting Physician   Chief Complaint:  Chief Complaint  Patient presents with   Weight Check   Sleeping Problem    4 week follow up. Patient states that her sleeping is no better    HPI: Elaine Lopez is a 31 y.o. female presenting on 12/05/2021 for Weight Check and Sleeping Problem (4 week follow up. Patient states that her sleeping is no better)  Insomnia: Lunesta dose was increased 4 weeks ago. She reports today she is not sleeping any better. She has previously failed treatment with OTC medications, hydroxyzine, and Trazodone.   Obesity: Patient was started on Adipex in May (one month ago). She reports she had a lot of energy and heart palpitations for the first five days, but this has resolved. She is still experiencing a dry mouth and has had an increase in headaches. She is making healthier food choices, eating less, and trying to get more exercise.    Social history:  Relevant past medical, surgical, family and social history  reviewed and updated as indicated. Interim medical history since our last visit reviewed.  Allergies and medications reviewed and updated.  DATA REVIEWED: CHART IN EPIC  ROS: Negative unless specifically indicated above in HPI.    Current Outpatient Medications:    busPIRone (BUSPAR) 15 MG tablet, Take 1 tablet (15 mg total) by mouth 2 (two) times daily., Disp: 180 tablet, Rfl: 1   citalopram (CELEXA) 40 MG tablet, Take 1 tablet (40 mg total) by mouth daily., Disp: 90 tablet, Rfl: 1   eszopiclone (LUNESTA) 2 MG TABS tablet, Take 1 tablet (2 mg total) by mouth at bedtime as needed for sleep. Take immediately before bedtime, Disp: 30 tablet, Rfl: 0   gabapentin (NEURONTIN) 300 MG capsule, Take 2 capsules (600 mg total) by mouth at bedtime., Disp: 180 capsule, Rfl: 1   Melatonin 10 MG CAPS, Take 2 capsules by mouth at bedtime. , Disp: , Rfl:    Omeprazole 20 MG TBEC, Take 20 mg by mouth daily., Disp: , Rfl:    phentermine (ADIPEX-P) 37.5 MG tablet, Take 1 tablet (37.5 mg total) by mouth daily before breakfast., Disp: 30 tablet, Rfl: 0   propranolol (INDERAL) 20 MG tablet, Take 1 tablet (20 mg total) by mouth 2 (two) times daily., Disp: 180 tablet, Rfl: 1   Relugolix-Estradiol-Norethind (MYFEMBREE) 40-1-0.5 MG TABS, Take 1 tablet by mouth daily., Disp: 28 tablet, Rfl: 11   VITAMIN D, CHOLECALCIFEROL, PO, Take 1,000 Units by mouth daily., Disp: , Rfl:    hydrOXYzine (ATARAX) 10 MG  tablet, Take 1 tablet (10 mg total) by mouth at bedtime. (Patient not taking: Reported on 12/05/2021), Disp: 90 tablet, Rfl: 1   methocarbamol (ROBAXIN) 500 MG tablet, Take 1 tablet (500 mg total) by mouth every 8 (eight) hours as needed for muscle spasms. (Patient not taking: Reported on 12/05/2021), Disp: 60 tablet, Rfl: 1   naproxen (NAPROSYN) 500 MG tablet, Take 1 tablet (500 mg total) by mouth 2 (two) times daily with a meal. (Patient not taking: Reported on 12/05/2021), Disp: 30 tablet, Rfl: 0   No Known  Allergies Past Medical History:  Diagnosis Date   Abnormal TSH 01/26/2019   Allergy    Chronic depression    Endometriosis    stage 4    History of drug abuse (HCC)    Lumbar herniated disc    x3   PTSD (post-traumatic stress disorder)    Vitamin D insufficiency 01/26/2019    Past Surgical History:  Procedure Laterality Date   laproscopy     x3   LUMBAR EPIDURAL INJECTION     TONSILLECTOMY AND ADENOIDECTOMY      Social History   Socioeconomic History   Marital status: Single    Spouse name: Not on file   Number of children: 0   Years of education: Not on file   Highest education level: Not on file  Occupational History   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   Sexual activity: Yes    Birth control/protection: Pill  Other Topics Concern   Not on file  Social History Narrative   Not on file   Social Determinants of Health   Financial Resource Strain: Medium Risk (04/03/2021)   Overall Financial Resource Strain (CARDIA)    Difficulty of Paying Living Expenses: Somewhat hard  Food Insecurity: No Food Insecurity (04/03/2021)   Hunger Vital Sign    Worried About Running Out of Food in the Last Year: Never true    Ran Out of Food in the Last Year: Never true  Transportation Needs: No Transportation Needs (04/03/2021)   PRAPARE - Hydrologist (Medical): No    Lack of Transportation (Non-Medical): No  Physical Activity: Insufficiently Active (04/03/2021)   Exercise Vital Sign    Days of Exercise per Week: 5 days    Minutes of Exercise  per Session: 10 min  Stress: Stress Concern Present (04/03/2021)   [place]    Feeling of Stress : Rather much  Social Connections: Socially Isolated (04/03/2021)   Social Connection and Isolation Panel [NHANES]    Frequency of Communication with Friends and Family: More than three times a week    Frequency of Social Gatherings with Friends and Family: Once a week    Attends Religious Services: Never    Marine scientist or Organizations: No    Attends Archivist Meetings: Never    Marital Status: Never married  Intimate Partner Violence: Not  At Risk (04/03/2021)   Humiliation, Afraid, Rape, and Kick questionnaire    Fear of Current or Ex-Partner: No    Emotionally Abused: No    Physically Abused: No    Sexually Abused: No        Objective:    BP 118/82   Pulse 72   Temp 97.9 F (36.6 C) (Temporal)   Ht '5\' 10"'  (1.778 m)   Wt 238 lb (108 kg)   SpO2 98%   BMI 34.15 kg/m   Wt Readings from Last 3 Encounters:  12/05/21 238 lb (108 kg)  11/07/21 245 lb 3.2 oz (111.2 kg)  09/07/21 248 lb 6.4 oz (112.7 kg)    Physical Exam Vitals reviewed.  Constitutional:      General: She is not in acute distress.    Appearance: Normal appearance. She is obese. She is not ill-appearing, toxic-appearing or diaphoretic.  HENT:     Head: Normocephalic and atraumatic.  Eyes:     General: No scleral icterus.       Right eye: No discharge.        Left eye: No discharge.     Conjunctiva/sclera: Conjunctivae normal.  Cardiovascular:     Rate and Rhythm: Normal rate and regular rhythm.     Heart sounds: Normal heart sounds. No murmur heard.    No friction rub. No gallop.  Pulmonary:     Effort: Pulmonary effort is normal. No respiratory distress.     Breath sounds: Normal breath sounds. No stridor. No wheezing, rhonchi or rales.  Musculoskeletal:        General: Normal range of motion.     Cervical back:  Normal range of motion.  Skin:    General: Skin is warm and dry.     Capillary Refill: Capillary refill takes less than 2 seconds.  Neurological:     General: No focal deficit present.     Mental Status: She is alert and oriented to person, place, and time. Mental status is at baseline.  Psychiatric:        Mood and Affect: Mood normal.        Behavior: Behavior normal.        Thought Content: Thought content normal.        Judgment: Judgment normal.    Lab Results  Component Value Date   TSH 1.910 10/30/2021   Lab Results  Component Value Date   WBC 10.0 10/30/2021   HGB 13.2 10/30/2021   HCT 38.1 10/30/2021   MCV 86 10/30/2021   PLT 317 10/30/2021   Lab Results  Component Value Date   NA 138 10/30/2021   K 4.3 10/30/2021   CO2 23 10/30/2021   GLUCOSE 85 10/30/2021   BUN 15 10/30/2021   CREATININE 0.75 10/30/2021   BILITOT <0.2 10/30/2021   ALKPHOS 98 10/30/2021   AST 16 10/30/2021   ALT 17 10/30/2021   PROT 7.3 10/30/2021   ALBUMIN 4.8 10/30/2021   CALCIUM 10.1 10/30/2021   EGFR 110 10/30/2021   Lab Results  Component Value Date   CHOL 153 02/25/2020   Lab Results  Component Value Date   HDL 33 (L) 02/25/2020   Lab Results  Component Value Date   LDLCALC 93 02/25/2020   Lab Results  Component Value Date   TRIG 154 (H) 02/25/2020   Lab Results  Component Value Date   CHOLHDL 4.6 (H) 02/25/2020   No results found for: "HGBA1C"

## 2021-12-06 NOTE — Telephone Encounter (Signed)
Pharmacy comment: drug not covered-insurance prefers temazepam 15 mg or 30 mg.

## 2021-12-06 NOTE — Telephone Encounter (Signed)
Changed to covered dosage.

## 2021-12-11 ENCOUNTER — Encounter: Payer: Self-pay | Admitting: Obstetrics & Gynecology

## 2022-01-05 ENCOUNTER — Other Ambulatory Visit: Payer: Self-pay | Admitting: *Deleted

## 2022-01-05 ENCOUNTER — Other Ambulatory Visit: Payer: Self-pay | Admitting: Family Medicine

## 2022-01-05 ENCOUNTER — Encounter: Payer: Self-pay | Admitting: Family Medicine

## 2022-01-05 DIAGNOSIS — F5101 Primary insomnia: Secondary | ICD-10-CM

## 2022-01-05 DIAGNOSIS — E669 Obesity, unspecified: Secondary | ICD-10-CM

## 2022-01-05 MED ORDER — TEMAZEPAM 15 MG PO CAPS: 15.0000 mg | ORAL_CAPSULE | Freq: Every evening | ORAL | 0 refills | Status: DC | PRN

## 2022-01-11 ENCOUNTER — Encounter: Payer: Self-pay | Admitting: Family Medicine

## 2022-01-11 ENCOUNTER — Ambulatory Visit (INDEPENDENT_AMBULATORY_CARE_PROVIDER_SITE_OTHER): Payer: 59 | Admitting: Family Medicine

## 2022-01-11 VITALS — BP 106/72 | HR 70 | Temp 97.4°F | Ht 70.0 in | Wt 229.0 lb

## 2022-01-11 DIAGNOSIS — F411 Generalized anxiety disorder: Secondary | ICD-10-CM | POA: Diagnosis not present

## 2022-01-11 DIAGNOSIS — N809 Endometriosis, unspecified: Secondary | ICD-10-CM

## 2022-01-11 DIAGNOSIS — F5101 Primary insomnia: Secondary | ICD-10-CM

## 2022-01-11 DIAGNOSIS — E669 Obesity, unspecified: Secondary | ICD-10-CM

## 2022-01-11 DIAGNOSIS — F32A Depression, unspecified: Secondary | ICD-10-CM | POA: Diagnosis not present

## 2022-01-11 MED ORDER — TEMAZEPAM 15 MG PO CAPS: 15.0000 mg | ORAL_CAPSULE | Freq: Every evening | ORAL | 2 refills | Status: DC | PRN

## 2022-01-11 MED ORDER — PHENTERMINE HCL 37.5 MG PO TABS: 37.5000 mg | ORAL_TABLET | Freq: Every day | ORAL | 0 refills | Status: DC

## 2022-01-11 NOTE — Progress Notes (Signed)
Assessment & Plan:  1. Obesity (BMI 30-39.9) Encouraged healthy eating and exercise.  Adipex prescription #3 prescribed today. Starting weight today in May 2023: 245 lbs Today's weight: 229 lbs Weight loss in the past month: 9 lbs Total weight loss: 16 lbs - phentermine (ADIPEX-P) 37.5 MG tablet; Take 1 tablet (37.5 mg total) by mouth daily before breakfast.  Dispense: 30 tablet; Refill: 0  2. Primary insomnia Well controlled on current regimen. Patient only wants to take for a short time as she does not want to be on a controlled substance long term. - temazepam (RESTORIL) 15 MG capsule; Take 1 capsule (15 mg total) by mouth at bedtime as needed for sleep.  Dispense: 30 capsule; Refill: 2  3-4. Chronic depression/Generalized anxiety disorder Uncontrolled. GeneSight testing completed today.   5. Endometriosis Encouraged to reach out to her GYN to discuss other treatment options due to worsening depression since starting Myfembree.   Return in about 4 weeks (around 02/08/2022) for Weight.  Hendricks Limes, MSN, APRN, FNP-C [facility]  Subjective:    [name], female    DOB: 11-14-1990, 31 y.o.   MRN: [id number]  Patient Care Team: Loman Brooklyn, FNP as PCP - General (Family Medicine) Dr. Hoyle Barr as Consulting Physician (Psychiatry) [place] as Consulting Physician   Chief Complaint:  Chief Complaint  Patient presents with   Insomnia   Weight Check    4 week follow up.    Depression    Patient states that her depression is worse     HPI: Elaine Lopez is a 31 y.o. female presenting on 01/11/2022 for Insomnia, Weight Check (4 week follow up. ), and Depression (Patient states that her depression is worse ) 1 Insomnia: started on temazepam last month, which she reports was working well for her while she had it. She has previously failed treatment with OTC medications, hydroxyzine, Lunesta, and Trazodone.   Obesity:  Patient was started on Adipex in May (two month ago). She is making healthier food choices, eating less, and trying to get more exercise.   Depression: she feels her depression is worse. This has been worsening since she started the [facility] in January. She has previously failed treatment with doxepin and Wellbutrin.     01/11/2022   10:03 AM 12/05/2021    4:07 PM 11/07/2021    3:30 PM  Depression screen PHQ 2/9  Decreased Interest 3 0 2  Down, Depressed, Hopeless 3 0 2  PHQ - 2 Score 6 0 4  Altered sleeping '3 3 3  ' Tired, decreased energy 3 0 3  Change in appetite '3 3 3  ' Feeling bad or failure about yourself  2 0 1  Trouble concentrating 2 0 0  Moving slowly or fidgety/restless 2 0 0  Suicidal thoughts 0 0 0  PHQ-9 Score '21 6 14  ' Difficult doing work/chores Extremely dIfficult Extremely dIfficult       01/11/2022   10:03 AM 12/05/2021    4:08 PM 11/07/2021    3:31 PM 05/31/2021    9:51 AM  GAD 7 : Generalized Anxiety Score  Nervous, Anxious, on Edge 3 0 3 3  Control/stop worrying 3 0 3 1  Worry too much - different things '3 3 3 2  ' Trouble relaxing 0 0 0 1  Restless 0 0 0 1  Easily annoyed or irritable '3 3 3 3  ' Afraid - awful might happen 1 0 2 3  Total GAD 7 Score  '13 6 14 14  ' Anxiety Difficulty Extremely difficult Somewhat difficult  Somewhat difficult    Social history:  Relevant past medical, surgical, family and social history reviewed and updated as indicated. Interim medical history since our last visit reviewed.  Allergies and medications reviewed and updated.  DATA REVIEWED: CHART IN EPIC  ROS: Negative unless specifically indicated above in HPI.    Current Outpatient Medications:    busPIRone (BUSPAR) 15 MG tablet, Take 1 tablet (15 mg total) by mouth 2 (two) times daily., Disp: 180 tablet, Rfl: 1   citalopram (CELEXA) 40 MG tablet, Take 1 tablet (40 mg total) by mouth daily., Disp: 90 tablet, Rfl: 1   gabapentin (NEURONTIN) 300 MG capsule, Take 2 capsules  (600 mg total) by mouth at bedtime., Disp: 180 capsule, Rfl: 1   Melatonin 10 MG CAPS, Take 2 capsules by mouth at bedtime. , Disp: , Rfl:    Omeprazole 20 MG TBEC, Take 20 mg by mouth daily., Disp: , Rfl:    phentermine (ADIPEX-P) 37.5 MG tablet, Take 1 tablet (37.5 mg total) by mouth daily before breakfast., Disp: 30 tablet, Rfl: 0   propranolol (INDERAL) 20 MG tablet, Take 1 tablet (20 mg total) by mouth 2 (two) times daily., Disp: 180 tablet, Rfl: 1   Relugolix-Estradiol-Norethind (MYFEMBREE) 40-1-0.5 MG TABS, Take 1 tablet by mouth daily., Disp: 28 tablet, Rfl: 11   temazepam (RESTORIL) 7.5 MG capsule, Take 7.5 mg by mouth at bedtime as needed for sleep., Disp: , Rfl:    VITAMIN D, CHOLECALCIFEROL, PO, Take 1,000 Units by mouth daily., Disp: , Rfl:    No Known Allergies Past Medical History:  Diagnosis Date   Abnormal TSH 01/26/2019   Allergy    Chronic depression    Endometriosis    stage 4    History of drug abuse (HCC)    Lumbar herniated disc    x3   PTSD (post-traumatic stress disorder)    Vitamin D insufficiency 01/26/2019    Past Surgical History:  Procedure Laterality Date   laproscopy     x3   LUMBAR EPIDURAL INJECTION     TONSILLECTOMY AND ADENOIDECTOMY      Social History   Socioeconomic History   Marital status: Single    Spouse name: Not on file   Number of children: 0   Years of education: Not on file   Highest education level: Not on file  Occupational History   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   Sexual activity: Yes    Birth  control/protection: Pill  Other Topics Concern   Not on file  Social History Narrative   Not on file   Social Determinants of Health   Financial Resource Strain: Medium Risk (04/03/2021)   Overall Financial Resource Strain (CARDIA)    Difficulty of Paying Living Expenses: Somewhat hard  Food Insecurity: No Food Insecurity (04/03/2021)   Hunger Vital Sign    Worried About Running Out of Food in the  Last Year: Never true    [place] in the Last Year: Never true  Transportation Needs: No Transportation Needs (04/03/2021)   PRAPARE - Hydrologist (Medical): No    Lack of Transportation (Non-Medical): No  Physical Activity: Insufficiently Active (04/03/2021)   Exercise Vital Sign    Days of Exercise per Week: 5 days    Minutes of Exercise per Session: 10 min  Stress: Stress Concern Present (04/03/2021)   [place]    Feeling of Stress : Rather much  Social Connections: Socially Isolated (04/03/2021)   Social Connection and Isolation Panel [NHANES]    Frequency of Communication with Friends and Family: More than three times a week    Frequency of Social Gatherings with Friends and Family: Once a week    Attends Religious Services: Never    Marine scientist or Organizations: No    Attends Archivist Meetings: Never    Marital Status: Never married  Intimate Partner Violence: Not At Risk (04/03/2021)   Humiliation, Afraid, Rape, and Kick questionnaire    Fear of Current or Ex-Partner: No    Emotionally Abused: No    Physically Abused: No    Sexually Abused: No        Objective:    BP 106/72   Pulse 70   Temp (!) 97.4 F (36.3 C) (Temporal)   Ht '5\' 10"'  (1.778 m)   Wt 229 lb (103.9 kg)   SpO2 98%   BMI 32.86 kg/m   Wt Readings from Last 3 Encounters:  01/11/22 229 lb (103.9 kg)  12/05/21 238 lb (108 kg)  11/07/21 245 lb 3.2 oz (111.2 kg)    Physical  Exam Vitals reviewed.  Constitutional:      General: She is not in acute distress.    Appearance: Normal appearance. She is obese. She is not ill-appearing, toxic-appearing or diaphoretic.  HENT:     Head: Normocephalic and atraumatic.  Eyes:     General: No scleral icterus.       Right eye: No discharge.        Left eye: No discharge.     Conjunctiva/sclera: Conjunctivae normal.  Cardiovascular:     Rate and Rhythm: Normal rate and regular rhythm.     Heart sounds: Normal heart sounds. No murmur heard.    No friction rub. No gallop.  Pulmonary:     Effort: Pulmonary effort is normal. No respiratory distress.     Breath sounds: Normal breath sounds. No stridor. No wheezing, rhonchi or rales.  Musculoskeletal:        General: Normal range of motion.     Cervical back: Normal range of motion.  Skin:    General: Skin is warm and dry.     Capillary Refill: Capillary refill takes less than 2 seconds.  Neurological:     General: No focal deficit present.     Mental Status: She is alert and oriented to person, place, and time. Mental status is at baseline.  Psychiatric:        Mood and Affect: Mood normal.        Behavior: Behavior normal.        Thought Content: Thought content normal.        Judgment: Judgment normal.    Lab Results  Component Value Date   TSH 1.910 10/30/2021   Lab Results  Component Value Date   WBC  10.0 10/30/2021   HGB 13.2 10/30/2021   HCT 38.1 10/30/2021   MCV 86 10/30/2021   PLT 317 10/30/2021   Lab Results  Component Value Date   NA 138 10/30/2021   K 4.3 10/30/2021   CO2 23 10/30/2021   GLUCOSE 85 10/30/2021   BUN 15 10/30/2021   CREATININE 0.75 10/30/2021   BILITOT <0.2 10/30/2021   ALKPHOS 98 10/30/2021   AST 16 10/30/2021   ALT 17 10/30/2021   PROT 7.3 10/30/2021   ALBUMIN 4.8 10/30/2021   CALCIUM 10.1 10/30/2021   EGFR 110 10/30/2021   Lab Results  Component Value Date   CHOL 153 02/25/2020   Lab Results  Component Value  Date   HDL 33 (L) 02/25/2020   Lab Results  Component Value Date   LDLCALC 93 02/25/2020   Lab Results  Component Value Date   TRIG 154 (H) 02/25/2020   Lab Results  Component Value Date   CHOLHDL 4.6 (H) 02/25/2020   No results found for: "HGBA1C"

## 2022-01-15 ENCOUNTER — Encounter: Payer: Self-pay | Admitting: Family Medicine

## 2022-01-16 ENCOUNTER — Telehealth: Payer: Self-pay | Admitting: Family Medicine

## 2022-01-16 ENCOUNTER — Encounter: Payer: Self-pay | Admitting: Family Medicine

## 2022-01-16 MED ORDER — DESVENLAFAXINE SUCCINATE ER 25 MG PO TB24: 25.0000 mg | ORAL_TABLET | Freq: Every day | ORAL | 1 refills | Status: DC

## 2022-01-16 NOTE — Telephone Encounter (Signed)
Patient return call. ?

## 2022-01-16 NOTE — Telephone Encounter (Signed)
Please let patient know I have received her GeneSight test results.  It looks like she would do well with Pristiq and that Celexa is probably not that good for her.  Is she agreeable to this change?  If yes she needs to decrease Celexa to 20 mg daily x2 weeks, then 20 mg every other day x2 weeks, then she can stop it.  She would be able to go ahead and start Pristiq 25 mg once daily while weaning off the Celexa.

## 2022-01-16 NOTE — Telephone Encounter (Signed)
Patient aware and verbalizes understanding. Medication sent

## 2022-01-16 NOTE — Addendum Note (Signed)
Addended by: Lorelee Cover C on: 01/16/2022 02:18 PM   Modules accepted: Orders

## 2022-01-16 NOTE — Telephone Encounter (Signed)
[email or web address]

## 2022-01-19 ENCOUNTER — Ambulatory Visit: Payer: 59 | Admitting: Family Medicine

## 2022-02-09 ENCOUNTER — Ambulatory Visit (INDEPENDENT_AMBULATORY_CARE_PROVIDER_SITE_OTHER): Payer: 59 | Admitting: Family Medicine

## 2022-02-09 ENCOUNTER — Encounter: Payer: Self-pay | Admitting: Family Medicine

## 2022-02-09 VITALS — BP 116/77 | HR 83 | Temp 97.8°F | Ht 70.0 in | Wt 224.0 lb

## 2022-02-09 DIAGNOSIS — N809 Endometriosis, unspecified: Secondary | ICD-10-CM

## 2022-02-09 DIAGNOSIS — F5101 Primary insomnia: Secondary | ICD-10-CM | POA: Diagnosis not present

## 2022-02-09 DIAGNOSIS — F411 Generalized anxiety disorder: Secondary | ICD-10-CM | POA: Diagnosis not present

## 2022-02-09 DIAGNOSIS — R519 Headache, unspecified: Secondary | ICD-10-CM

## 2022-02-09 DIAGNOSIS — E669 Obesity, unspecified: Secondary | ICD-10-CM | POA: Insufficient documentation

## 2022-02-09 DIAGNOSIS — F32A Depression, unspecified: Secondary | ICD-10-CM | POA: Diagnosis not present

## 2022-02-09 MED ORDER — TEMAZEPAM 30 MG PO CAPS: 30.0000 mg | ORAL_CAPSULE | Freq: Every evening | ORAL | 2 refills | Status: DC | PRN

## 2022-02-09 MED ORDER — PHENTERMINE HCL 37.5 MG PO TABS: 37.5000 mg | ORAL_TABLET | Freq: Every day | ORAL | 0 refills | Status: DC

## 2022-02-09 MED ORDER — GABAPENTIN 300 MG PO CAPS: 600.0000 mg | ORAL_CAPSULE | Freq: Three times a day (TID) | ORAL | 1 refills | Status: DC | PRN

## 2022-02-09 NOTE — Patient Instructions (Signed)
Gabapentin:   8 AM 2 PM Bedtime  Start 0 0 1 tablet  After 3-5 days 1 tablet 0 1 tablet  After 3-5 days 1 tablet 1 tablet 1 tablet  After 3-5 days 1 tablet 1 tablet 2 tablets  After 3-5 days 2 tablets 1 tablet 2 tablets  After 3-5 days 2 tablets 2 tablets 2 tablets  After 3-5 days 2 tablets 2 tablets 3 tablets  After 3-5 days 3 tablets 2 tablets 3 tablets  After 3-5 days 3 tablets 3 tablets 3 tablets

## 2022-02-09 NOTE — Progress Notes (Signed)
Assessment & Plan:  1. Obesity (BMI 30-39.9) Encouraged healthy eating and exercise.  Adipex prescription #4 prescribed today. Starting weight today in May 2023: 245 lbs Today's weight: 224 lbs Weight loss in the past month: 5 lbs Total weight loss: 21 lbs - phentermine (ADIPEX-P) 37.5 MG tablet; Take 1 tablet (37.5 mg total) by mouth daily before breakfast.  Dispense: 30 tablet; Refill: 0  2. Primary insomnia Uncontrolled. Increasing dose from 15 mg to 30 mg.  - temazepam (RESTORIL) 30 MG capsule; Take 1 capsule (30 mg total) by mouth at bedtime as needed for sleep.  Dispense: 30 capsule; Refill: 2  3-4. Chronic depression/Generalized anxiety disorder Uncontrolled. Continue weaning Celexa and new start Pristiq.   5. Endometriosis Uncontrolled. Increasing gabapentin. Patient is currently taking 600 mg at bedtime. Advised she can increase by 300 mg every 3-5 days to a max of 900 TID. Encouraged to schedule follow-up with GYN.  - gabapentin (NEURONTIN) 300 MG capsule; Take 2 capsules (600 mg total) by mouth 3 (three) times daily as needed.  Dispense: 270 capsule; Refill: 1  6. New onset of headaches Discussed could be a side effect of weaning off Celexa and if so, this will improve. Advised to schedule eye exam. Recommended Tylenol with Ibuprofen to relieve headaches.    Return in about 4 weeks (around 03/09/2022) for follow-up of chronic medication conditions with T. Lilia Pro.  Hendricks Limes, MSN, APRN, FNP-C [facility]  Subjective:    [name], female    DOB: 08-02-1990, 31 y.o.   MRN: [id number]  Patient Care Team: Loman Brooklyn, FNP as PCP - General (Family Medicine) Dr. Hoyle Barr as Consulting Physician (Psychiatry) [place] as Consulting Physician   Chief Complaint:  Chief Complaint  Patient presents with   Weight Check   med follow up     4 week re check - Patient started pistiq 8/10 and still weening off the celexa    Headache    Patient states that she will have several day long headaches that has been going on the last few weeks.     HPI: Elaine Lopez is a 31 y.o. female presenting on 02/09/2022 for Weight Check, med follow up  (4 week re check - Patient started pistiq 8/10 and still weening off the celexa), and Headache (Patient states that she will have several day long headaches that has been going on the last few weeks. ) 1 Insomnia: on temazepam, which she reports is no longer working well for her as she is waking up during the night consistently again. She has previously failed treatment with OTC medications, hydroxyzine, Lunesta, and Trazodone.   Obesity: Patient was started on Adipex in May (three months ago). She is making healthier food choices, eating less, and trying to get more exercise.   Depression: she previously felt her depression was worse since she started the [facility] in January. She started weaning off Celexa and started Pristiq four weeks ago. She has previously failed treatment with doxepin and Wellbutrin.     02/09/2022    8:41 AM 01/11/2022   10:03 AM 12/05/2021    4:07 PM  Depression screen PHQ 2/9  Decreased Interest 3 3 0  Down, Depressed, Hopeless 2 3 0  PHQ - 2 Score 5 6 0  Altered sleeping '3 3 3  ' Tired, decreased energy 3 3 0  Change in appetite 0 3 3  Feeling bad or failure about yourself  2 2 0  Trouble  concentrating 1 2 0  Moving slowly or fidgety/restless 2 2 0  Suicidal thoughts 0 0 0  PHQ-9 Score '16 21 6  ' Difficult doing work/chores Extremely dIfficult Extremely dIfficult Extremely dIfficult      02/09/2022    8:41 AM 01/11/2022   10:03 AM 12/05/2021    4:08 PM 11/07/2021    3:31 PM  GAD 7 : Generalized Anxiety Score  Nervous, Anxious, on Edge 3 3 0 3  Control/stop worrying 3 3 0 3  Worry too much - different things '2 3 3 3  ' Trouble relaxing 1 0 0 0  Restless 2 0 0 0  Easily annoyed or irritable '3 3 3 3  ' Afraid - awful might happen 2 1 0 2  Total  GAD 7 Score '16 13 6 14  ' Anxiety Difficulty  Extremely difficult Somewhat difficult    Endometriosis: patient reports her pain is getting worse. She has been on gabapentin 600 mg at bedtime for a long time. Her pain is worse when she is sitting or lying down.   New Complaints:  Headache: Patient complains of headache. She does not have a headache at this time.   Description of Headaches: Location of pain: occipital, temporal Radiation of pain?:none Character of pain:aching and stabbing Severity of pain: 7 Accompanying symptoms: nausea, sonophobia Prodromal sx?: none Rapidity of onset: waking up with the headache Typical duration of individual headache: 3 days Are most headaches similar in presentation? yes Typical precipitants: unknown  Temporal Pattern of Headaches: Started having HAs a few weeks ago Worst time of day: worsens throughout the day Awaken from sleep?: no Seasonal pattern?: no 'Clustering' of HAs over time? no Overall pattern since problem began: unchanged  Degree of Functional Impairment:  moderate impairment by day 3 as she is unable to work  Current Use of Meds to Treat HA: Abortive meds? acetaminophen Daily use? yes - twice daily Prophylactic meds? none  Additional Relevant History: History of head/neck trauma? no History of head/neck surgery? no Family h/o headache problems? no Use of meds that might worsen HAs? She is currently weaning off Celexa Exposure to carbon monoxide? no Substance use: none  Patient feels her headaches may be due to needing a new contact prescription as she feels her vision has worsened.    Social history:  Relevant past medical, surgical, family and social history reviewed and updated as indicated. Interim medical history since our last visit reviewed.  Allergies and medications reviewed and updated.  DATA REVIEWED: CHART IN EPIC  ROS: Negative unless specifically indicated above in HPI.    Current Outpatient  Medications:    busPIRone (BUSPAR) 15 MG tablet, Take 1 tablet (15 mg total) by mouth 2 (two) times daily., Disp: 180 tablet, Rfl: 1   citalopram (CELEXA) 40 MG tablet, Take 1 tablet (40 mg total) by mouth daily. (Patient taking differently: Take 40 mg by mouth daily. 16m every other day), Disp: 90 tablet, Rfl: 1   desvenlafaxine (PRISTIQ) 25 MG 24 hr tablet, Take 1 tablet (25 mg total) by mouth daily., Disp: 30 tablet, Rfl: 1   gabapentin (NEURONTIN) 300 MG capsule, Take 2 capsules (600 mg total) by mouth at bedtime., Disp: 180 capsule, Rfl: 1   Melatonin 10 MG CAPS, Take 2 capsules by mouth at bedtime. , Disp: , Rfl:    Omeprazole 20 MG TBEC, Take 20 mg by mouth daily., Disp: , Rfl:    phentermine (ADIPEX-P) 37.5 MG tablet, Take 1 tablet (37.5 mg total) by mouth  daily before breakfast., Disp: 30 tablet, Rfl: 0   propranolol (INDERAL) 20 MG tablet, Take 1 tablet (20 mg total) by mouth 2 (two) times daily., Disp: 180 tablet, Rfl: 1   Relugolix-Estradiol-Norethind (MYFEMBREE) 40-1-0.5 MG TABS, Take 1 tablet by mouth daily., Disp: 28 tablet, Rfl: 11   temazepam (RESTORIL) 15 MG capsule, Take 1 capsule (15 mg total) by mouth at bedtime as needed for sleep., Disp: 30 capsule, Rfl: 2   VITAMIN D, CHOLECALCIFEROL, PO, Take 1,000 Units by mouth daily., Disp: , Rfl:    No Known Allergies Past Medical History:  Diagnosis Date   Abnormal TSH 01/26/2019   Allergy    Chronic depression    Endometriosis    stage 4    History of drug abuse (HCC)    Lumbar herniated disc    x3   PTSD (post-traumatic stress disorder)    Vitamin D insufficiency 01/26/2019    Past Surgical History:  Procedure Laterality Date   laproscopy     x3   LUMBAR EPIDURAL INJECTION     TONSILLECTOMY AND ADENOIDECTOMY      Social History   Socioeconomic History   Marital status: Single    Spouse name: Not on file   Number of children: 0   Years of education: Not on file   Highest education level: Not on file   Occupational History   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   Sexual activity: Yes    Birth control/protection: Pill  Other Topics Concern   Not on file  Social History Narrative   Not on file   Social Determinants of Health   Financial Resource Strain: Medium Risk (04/03/2021)   Overall Financial Resource Strain (CARDIA)    Difficulty of Paying Living Expenses: Somewhat hard  Food Insecurity: No Food Insecurity (04/03/2021)   Hunger Vital Sign    Worried About Running Out of Food in the Last Year: Never true    Ran Out of Food in the Last Year: Never true  Transportation Needs: No Transportation Needs (04/03/2021)   PRAPARE - Hydrologist (Medical): No    Lack of Transportation (Non-Medical): No  Physical Activity: Insufficiently Active (04/03/2021)   Exercise Vital Sign    Days of Exercise per Week: 5 days    Minutes of Exercise per Session: 10 min  Stress: Stress Concern Present (04/03/2021)   [place]    Feeling of Stress : Rather much  Social Connections: Socially Isolated (04/03/2021)   Social Connection and Isolation Panel [NHANES]    Frequency of Communication with Friends and Family: More than three times a week    Frequency of Social Gatherings with Friends and Family: Once a week    Attends Religious Services: Never    Marine scientist or Organizations: No    Attends Archivist Meetings: Never    Marital Status: Never married  Human resources officer  Violence: Not At Risk (04/03/2021)   Humiliation, Afraid, Rape, and Kick questionnaire    Fear of Current or Ex-Partner: No    Emotionally Abused: No    Physically Abused: No    Sexually Abused: No        Objective:    BP 116/77   Pulse 83   Temp 97.8 F (36.6 C) (Temporal)   Ht '5\' 10"'  (1.778 m)   Wt 224 lb (101.6 kg)   SpO2 97%   BMI 32.14 kg/m   Wt Readings from Last 3 Encounters:  02/09/22 224 lb (101.6 kg)  01/11/22 229 lb (103.9 kg)  12/05/21 238 lb (108 kg)    Physical Exam Vitals reviewed.  Constitutional:      General: She is not in acute distress.    Appearance: Normal appearance. She is obese. She is not ill-appearing, toxic-appearing or diaphoretic.  HENT:     Head: Normocephalic and atraumatic.  Eyes:     General: No scleral icterus.       Right eye: No discharge.        Left eye: No discharge.     Conjunctiva/sclera: Conjunctivae normal.  Cardiovascular:     Rate and Rhythm: Normal rate and regular rhythm.     Heart sounds: Normal heart sounds. No murmur heard.    No friction rub. No gallop.  Pulmonary:     Effort: Pulmonary effort is normal. No respiratory distress.     Breath sounds: Normal breath sounds. No stridor. No wheezing, rhonchi or rales.  Musculoskeletal:        General: Normal range of motion.     Cervical back: Normal range of motion.  Skin:    General: Skin is warm and dry.     Capillary Refill: Capillary refill takes less than 2 seconds.  Neurological:     General: No focal deficit present.     Mental Status: She is alert and oriented to person, place, and time. Mental status is at baseline.  Psychiatric:        Mood and Affect: Mood normal.        Behavior: Behavior normal.        Thought Content: Thought content normal.        Judgment: Judgment normal.    Lab Results  Component Value Date   TSH 1.910 10/30/2021   Lab Results  Component Value Date   WBC 10.0 10/30/2021   HGB 13.2 10/30/2021   HCT 38.1 10/30/2021    MCV 86 10/30/2021   PLT 317 10/30/2021   Lab Results  Component Value Date   NA 138 10/30/2021   K 4.3 10/30/2021   CO2 23 10/30/2021   GLUCOSE 85 10/30/2021   BUN 15 10/30/2021   CREATININE 0.75 10/30/2021   BILITOT <0.2 10/30/2021   ALKPHOS 98 10/30/2021   AST 16 10/30/2021   ALT 17 10/30/2021   PROT 7.3 10/30/2021   ALBUMIN 4.8 10/30/2021   CALCIUM 10.1 10/30/2021   EGFR 110 10/30/2021   Lab Results  Component Value Date   CHOL 153 02/25/2020   Lab Results  Component Value Date   HDL 33 (L) 02/25/2020   Lab Results  Component Value Date   LDLCALC 93 02/25/2020   Lab Results  Component Value Date   TRIG 154 (H) 02/25/2020   Lab Results  Component Value Date   CHOLHDL 4.6 (H) 02/25/2020   No results found for: "HGBA1C"

## 2022-02-21 ENCOUNTER — Telehealth: Payer: Self-pay | Admitting: *Deleted

## 2022-02-21 NOTE — Telephone Encounter (Signed)
Key: [id number] Drug Desvenlafaxine Succinate ER 25MG  er tablets Sent to plan

## 2022-02-22 NOTE — Telephone Encounter (Signed)
Karna Christmas Key: [id number] - PA Case ID: [id number] - Rx #: [id number] Outcome Approvedon September 6 Your PA request has been approved. Additional information will be provided in the approval communication. (Message 1145)  Pharmacy notified

## 2022-02-27 ENCOUNTER — Encounter: Payer: Self-pay | Admitting: *Deleted

## 2022-02-27 ENCOUNTER — Other Ambulatory Visit: Payer: Self-pay | Admitting: *Deleted

## 2022-03-01 ENCOUNTER — Other Ambulatory Visit: Payer: Self-pay | Admitting: *Deleted

## 2022-03-01 ENCOUNTER — Telehealth: Payer: Self-pay | Admitting: *Deleted

## 2022-03-01 MED ORDER — MYFEMBREE 40-1-0.5 MG PO TABS: 1.0000 | ORAL_TABLET | Freq: Every day | ORAL | 11 refills | Status: DC

## 2022-03-01 NOTE — Telephone Encounter (Signed)
Myfembree was sent to [facility] in [place]. JSY

## 2022-03-09 ENCOUNTER — Telehealth: Payer: Self-pay | Admitting: Nurse Practitioner

## 2022-03-09 ENCOUNTER — Telehealth (INDEPENDENT_AMBULATORY_CARE_PROVIDER_SITE_OTHER): Payer: 59 | Admitting: Nurse Practitioner

## 2022-03-09 ENCOUNTER — Other Ambulatory Visit: Payer: Self-pay | Admitting: Nurse Practitioner

## 2022-03-09 ENCOUNTER — Encounter: Payer: Self-pay | Admitting: Nurse Practitioner

## 2022-03-09 DIAGNOSIS — F411 Generalized anxiety disorder: Secondary | ICD-10-CM

## 2022-03-09 DIAGNOSIS — F32A Depression, unspecified: Secondary | ICD-10-CM

## 2022-03-09 DIAGNOSIS — R69 Illness, unspecified: Secondary | ICD-10-CM | POA: Diagnosis not present

## 2022-03-09 DIAGNOSIS — E669 Obesity, unspecified: Secondary | ICD-10-CM | POA: Diagnosis not present

## 2022-03-09 MED ORDER — PHENTERMINE HCL 37.5 MG PO TABS: 37.5000 mg | ORAL_TABLET | Freq: Every day | ORAL | 0 refills | Status: DC

## 2022-03-09 MED ORDER — DESVENLAFAXINE SUCCINATE ER 25 MG PO TB24: 25.0000 mg | ORAL_TABLET | Freq: Every day | ORAL | 1 refills | Status: DC

## 2022-03-09 NOTE — Patient Instructions (Signed)

## 2022-03-09 NOTE — Telephone Encounter (Signed)
  Prescription Request  03/09/2022  Is this a "Controlled Substance" medicine? No   Have you seen your PCP in the last 2 weeks? Televisit 9/22  If YES, route message to pool  -  If NO, patient needs to be scheduled for appointment.  What is the name of the medication or equipment? phentermine (ADIPEX-P) 37.5 MG tablet and desvenlafaxine (PRISTIQ) 25 MG 24 hr tablet  Have you contacted your pharmacy to request a refill? Yes, wrong pharmacy    Which pharmacy would you like this sent to? [facility] in [place]    Patient notified that their request is being sent to the clinical staff for review and that they should receive a response within 2 business days.

## 2022-03-09 NOTE — Telephone Encounter (Signed)
Medication resent to [facility]

## 2022-03-09 NOTE — Assessment & Plan Note (Signed)
Completed PHQ-9.  Patient currently managed on Pristiq continue on current dose and follow-up with PCP in 3 months.

## 2022-03-09 NOTE — Progress Notes (Signed)
Virtual Visit  Note Due to COVID-19 pandemic this visit was conducted virtually. This visit type was conducted due to national recommendations for restrictions regarding the COVID-19 Pandemic (e.g. social distancing, sheltering in place) in an effort to limit this patient's exposure and mitigate transmission in our community. All issues noted in this document were discussed and addressed.  A physical exam was not performed with this format.  I connected with Elaine Lopez on 03/09/22 at 3:30 PM by telephone and verified that I am speaking with the correct person using two identifiers. Elaine Lopez is currently located at home during visit. The provider, Daryll Drown, NP is located in their office at time of visit.  I discussed the limitations, risks, security and privacy concerns of performing an evaluation and management service by telephone and the availability of in person appointments. I also discussed with the patient that there may be a patient responsible charge related to this service. The patient expressed understanding and agreed to proceed.   History and Present Illness:  HPI Depression, Follow-up  She  was last seen for this 4 weeks ago. Changes made at last visit include Pristiq 25 mg tablet by mouth daily..   She reports good compliance with treatment. She is not having side effects.   She reports good tolerance of treatment. Current symptoms include: anhedonia, depressed mood, difficulty concentrating, and fatigue She feels she is Improved since last visit.     03/09/2022    3:33 PM 02/09/2022    8:41 AM 01/11/2022   10:03 AM  Depression screen PHQ 2/9  Decreased Interest 2 3 3   Down, Depressed, Hopeless 3 2 3   PHQ - 2 Score 5 5 6   Altered sleeping 3 3 3   Tired, decreased energy 3 3 3   Change in appetite 1 0 3  Feeling bad or failure about yourself  2 2 2   Trouble concentrating 1 1 2   Moving slowly or fidgety/restless 0 2 2  Suicidal thoughts 0 0 0  PHQ-9  Score 15 16 21   Difficult doing work/chores Extremely dIfficult Extremely dIfficult Extremely dIfficult    Anxiety: Patient complains of anxiety disorder.  She has the following symptoms: difficulty concentrating, feelings of losing control. Onset of symptoms was approximately 4 weeks ago, stable since that time. She denies current suicidal and homicidal ideation. Family history significant for no psychiatric illness.Possible organic causes contributing are: none. Risk factors: previous episode of depression Previous treatment includes BuSpar 7.5 mg tablet and  Pristiq 25 mg tablet by mouth daily. .  She complains of the following side effects from the treatment: none.    ROS   Observations/Objective: Televisit patient not in distress  Assessment and Plan: Chronic depression Completed PHQ-9.  Patient currently managed on Pristiq continue on current dose and follow-up with PCP in 3 months.  Obesity (BMI 30-39.9) Patient is a total of 98 kg today and progressively losing weight on phentermine.  No changes necessary to current dose.Rx refill sent to pharmacy.  Follow-up in a month.  Patient verbalized desire to gradually wean off of phentermine in about a month.  Education provided to patient.  Advised patient to talk with PCP on beginning to wean off of phentermine in about a month.  Generalized anxiety disorder Patient managed with Pristiq 25 mg tablet by mouth daily.  Completed GAD-7.  Follow-up with PCP in 3 months.   Follow Up Instructions: Follow-up in 3 months    I discussed the assessment and treatment plan with the  patient. The patient was provided an opportunity to ask questions and all were answered. The patient agreed with the plan and demonstrated an understanding of the instructions.   The patient was advised to call back or seek an in-person evaluation if the symptoms worsen or if the condition fails to improve as anticipated.  The above assessment and management plan was  discussed with the patient. The patient verbalized understanding of and has agreed to the management plan. Patient is aware to call the clinic if symptoms persist or worsen. Patient is aware when to return to the clinic for a follow-up visit. Patient educated on when it is appropriate to go to the emergency department.   Time call ended: 3:45 PM  I provided 15 minutes of  non face-to-face time during this encounter.    Ivy Lynn, NP

## 2022-03-09 NOTE — Assessment & Plan Note (Signed)
Patient managed with Pristiq 25 mg tablet by mouth daily.  Completed GAD-7.  Follow-up with PCP in 3 months.

## 2022-03-09 NOTE — Assessment & Plan Note (Signed)
Patient is a total of 98 kg today and progressively losing weight on phentermine.  No changes necessary to current dose.Rx refill sent to pharmacy.  Follow-up in a month.  Patient verbalized desire to gradually wean off of phentermine in about a month.  Education provided to patient.  Advised patient to talk with PCP on beginning to wean off of phentermine in about a month.

## 2022-03-09 NOTE — Addendum Note (Signed)
Addended by: Ivy Lynn on: 03/09/2022 05:33 PM   Modules accepted: Orders

## 2022-03-20 ENCOUNTER — Telehealth: Payer: Self-pay | Admitting: *Deleted

## 2022-03-20 NOTE — Telephone Encounter (Signed)
Spoke with pt. 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 Pt has decided not to continue taking Myfembree. She hasn't been on it for 3 weeks. It helped with her pain but made her more depressed. Forrest aware of this. [place]

## 2022-04-09 ENCOUNTER — Telehealth: Payer: Self-pay | Admitting: Nurse Practitioner

## 2022-04-09 ENCOUNTER — Ambulatory Visit: Payer: 59 | Admitting: Nurse Practitioner

## 2022-04-09 ENCOUNTER — Encounter: Payer: Self-pay | Admitting: Nurse Practitioner

## 2022-04-09 VITALS — BP 136/84 | HR 79 | Temp 98.6°F | Ht 70.0 in | Wt 215.0 lb

## 2022-04-09 DIAGNOSIS — E669 Obesity, unspecified: Secondary | ICD-10-CM

## 2022-04-09 DIAGNOSIS — R69 Illness, unspecified: Secondary | ICD-10-CM | POA: Diagnosis not present

## 2022-04-09 DIAGNOSIS — F411 Generalized anxiety disorder: Secondary | ICD-10-CM | POA: Diagnosis not present

## 2022-04-09 DIAGNOSIS — F32A Depression, unspecified: Secondary | ICD-10-CM | POA: Diagnosis not present

## 2022-04-09 MED ORDER — DESVENLAFAXINE SUCCINATE ER 50 MG PO TB24: 50.0000 mg | ORAL_TABLET | Freq: Every day | ORAL | 3 refills | Status: DC

## 2022-04-09 MED ORDER — BUSPIRONE HCL 15 MG PO TABS: 15.0000 mg | ORAL_TABLET | Freq: Two times a day (BID) | ORAL | 1 refills | Status: DC

## 2022-04-09 MED ORDER — PHENTERMINE HCL 37.5 MG PO TABS: 37.5000 mg | ORAL_TABLET | Freq: Every day | ORAL | 0 refills | Status: DC

## 2022-04-09 NOTE — Assessment & Plan Note (Signed)
Anxiety not well controlled Pristiq increased from 25 mg tablet by mouth daily to 50 mg tablet by mouth daily.  Completed psych referral.  GAD-7 completed.  Follow-up in 6 weeks.

## 2022-04-09 NOTE — Patient Instructions (Signed)
Generalized Anxiety Disorder, Adult Generalized anxiety disorder (GAD) is a mental health condition. Unlike normal worries, anxiety related to GAD is not triggered by a specific event. These worries do not fade or get better with time. GAD interferes with relationships, work, and school. GAD symptoms can vary from mild to severe. People with severe GAD can have intense waves of anxiety with physical symptoms that are similar to panic attacks. What are the causes? The exact cause of GAD is not known, but the following are believed to have an impact: Differences in natural brain chemicals. Genes passed down from parents to children. Differences in the way threats are perceived. Development and stress during childhood. Personality. What increases the risk? The following factors may make you more likely to develop this condition: Being female. Having a family history of anxiety disorders. Being very shy. Experiencing very stressful life events, such as the death of a loved one. Having a very stressful family environment. What are the signs or symptoms? People with GAD often worry excessively about many things in their lives, such as their health and family. Symptoms may also include: Mental and emotional symptoms: Worrying excessively about natural disasters. Fear of being late. Difficulty concentrating. Fears that others are judging your performance. Physical symptoms: Fatigue. Headaches, muscle tension, muscle twitches, trembling, or feeling shaky. Feeling like your heart is pounding or beating very fast. Feeling out of breath or like you cannot take a deep breath. Having trouble falling asleep or staying asleep, or experiencing restlessness. Sweating. Nausea, diarrhea, or irritable bowel syndrome (IBS). Behavioral symptoms: Experiencing erratic moods or irritability. Avoidance of new situations. Avoidance of people. Extreme difficulty making decisions. How is this diagnosed? This  condition is diagnosed based on your symptoms and medical history. You will also have a physical exam. Your health care provider may perform tests to rule out other possible causes of your symptoms. To be diagnosed with GAD, a person must have anxiety that: Is out of his or her control. Affects several different aspects of his or her life, such as work and relationships. Causes distress that makes him or her unable to take part in normal activities. Includes at least three symptoms of GAD, such as restlessness, fatigue, trouble concentrating, irritability, muscle tension, or sleep problems. Before your health care provider can confirm a diagnosis of GAD, these symptoms must be present more days than they are not, and they must last for 6 months or longer. How is this treated? This condition may be treated with: Medicine. Antidepressant medicine is usually prescribed for Certain-term daily control. Anti-anxiety medicines may be added in severe cases, especially when panic attacks occur. Talk therapy (psychotherapy). Certain types of talk therapy can be helpful in treating GAD by providing support, education, and guidance. Options include: Cognitive behavioral therapy (CBT). People learn coping skills and self-calming techniques to ease their physical symptoms. They learn to identify unrealistic thoughts and behaviors and to replace them with more appropriate thoughts and behaviors. Acceptance and commitment therapy (ACT). This treatment teaches people how to be mindful as a way to cope with unwanted thoughts and feelings. Biofeedback. This process trains you to manage your body's response (physiological response) through breathing techniques and relaxation methods. You will work with a therapist while machines are used to monitor your physical symptoms. Stress management techniques. These include yoga, meditation, and exercise. A mental health specialist can help determine which treatment is best for you.  Some people see improvement with one type of therapy. However, other people require   a combination of therapies. Follow these instructions at home: Lifestyle Maintain a consistent routine and schedule. Anticipate stressful situations. Create a plan and allow extra time to work with your plan. Practice stress management or self-calming techniques that you have learned from your therapist or your health care provider. Exercise regularly and spend time outdoors. Eat a healthy diet that includes plenty of vegetables, fruits, whole grains, low-fat dairy products, and lean protein. Do not eat a lot of foods that are high in fat, added sugar, or salt (sodium). Drink plenty of water. Avoid alcohol. Alcohol can increase anxiety. Avoid caffeine and certain over-the-counter cold medicines. These may make you feel worse. Ask your pharmacist which medicines to avoid. General instructions Take over-the-counter and prescription medicines only as told by your health care provider. Understand that you are likely to have setbacks. Accept this and be kind to yourself as you persist to take better care of yourself. Anticipate stressful situations. Create a plan and allow extra time to work with your plan. Recognize and accept your accomplishments, even if you judge them as small. Spend time with people who care about you. Keep all follow-up visits. This is important. Where to find more information National Institute of Mental Health: www.nimh.nih.gov Substance Abuse and Mental Health Services: www.samhsa.gov Contact a health care provider if: Your symptoms do not get better. Your symptoms get worse. You have signs of depression, such as: A persistently sad or irritable mood. Loss of enjoyment in activities that used to bring you joy. Change in weight or eating. Changes in sleeping habits. Get help right away if: You have thoughts about hurting yourself or others. If you ever feel like you may hurt  yourself or others, or have thoughts about taking your own life, get help right away. Go to your nearest emergency department or: Call your local emergency services (911 in the [place] Call a suicide crisis helpline, such as the National Suicide Prevention Lifeline at 1-800-273-8255 or 988 in the [place] This is open 24 hours a day in the [place] Text the Crisis Text Line at 741741 (in the [place] Summary Generalized anxiety disorder (GAD) is a mental health condition that involves worry that is not triggered by a specific event. People with GAD often worry excessively about many things in their lives, such as their health and family. GAD may cause symptoms such as restlessness, trouble concentrating, sleep problems, frequent sweating, nausea, diarrhea, headaches, and trembling or muscle twitching. A mental health specialist can help determine which treatment is best for you. Some people see improvement with one type of therapy. However, other people require a combination of therapies. This information is not intended to replace advice given to you by your health care provider. Make sure you discuss any questions you have with your health care provider. Document Revised: 12/28/2020 Document Reviewed: 09/25/2020 Elsevier Patient Education  2023 Elsevier Inc. Major Depressive Disorder, Adult Major depressive disorder is a mental health condition. This disorder affects feelings. It can also affect the body. Symptoms of this condition last most of the day, almost every day, for 2 weeks. This disorder can affect: Relationships. Daily activities, such as work and school. Activities that you normally like to do. What are the causes? The cause of this condition is not known. The disorder is likely caused by a mix of things, including: Your personality, such as being a shy person. Your behavior, or how you act toward others. Your thoughts and feelings. Too much alcohol or drugs. How you react to    stress. Health and mental problems that you have had for a long time. Things that hurt you in the past (trauma). Big changes in your life, such as divorce. What increases the risk? The following factors may make you more likely to develop this condition: Having family members with depression. Being a woman. Problems in the family. Low levels of some brain chemicals. Things that caused you pain as a child, especially if you lost a parent or were abused. A lot of stress in your life, such as from: Living without basic needs of life, such as food and shelter. Being treated poorly because of race, sex, or religion (discrimination). Health and mental problems that you have had for a long time. What are the signs or symptoms? The main symptoms of this condition are: Being sad all the time. Being grouchy all the time. Loss of interest in things and activities. Other symptoms include: Sleeping too much or too little. Eating too much or too little. Gaining or losing weight, without knowing why. Feeling tired or having low energy. Being restless and weak. Feeling hopeless, worthless, or guilty. Trouble thinking clearly or making decisions. Thoughts of hurting yourself or others, or thoughts of ending your life. Spending a lot of time alone. Inability to complete common tasks of daily life. If you have very bad MDD, you may: Believe things that are not true. Hear, see, taste, or feel things that are not there. Have mild depression that lasts for at least 2 years. Feel very sad and hopeless. Have trouble speaking or moving. How is this treated? This condition may be treated with: Talk therapy. This teaches you to know bad thoughts, feelings, and actions and how to change them. This can also help you to communicate with others. This can be done with members of your family. Medicines. These can be used to treat worry (anxiety), depression, or low levels of chemicals in the  brain. Lifestyle changes. You may need to: Limit alcohol use. Limit drug use. Get regular exercise. Get plenty of sleep. Make healthy eating choices. Spend more time outdoors. Brain stimulation. This treatment excites the brain. This is done when symptoms are very bad or have not gotten better with other treatments. Follow these instructions at home: Activity Get regular exercise as told. Spend time outdoors as told. Make time to do the things you enjoy. Find ways to deal with stress. Try to: Meditate. Do deep breathing. Spend time in nature. Keep a journal. Return to your normal activities as told by your doctor. Ask your doctor what activities are safe for you. Alcohol and drug use If you drink alcohol: Limit how much you use to: 0-1 drink a day for women. 0-2 drinks a day for men. Be aware of how much alcohol is in your drink. In the [place]., one drink equals one 12 oz bottle of beer (355 mL), one 5 oz glass of wine (148 mL), or one 1 oz glass of hard liquor (44 mL). Talk to your doctor about: Alcohol use. Alcohol can affect some medicines. Any drug use. General instructions  Take over-the-counter and prescription medicines and herbal preparations only as told by your doctor. Eat a healthy diet. Get a lot of sleep. Think about joining a support group. Your doctor may be able to suggest one. Keep all follow-up visits as told by your doctor. This is important. Where to find more information: [employer] on Mental Illness: www.nami.[place]: https://carter.com/ American Psychiatric Association: www.psychiatry.org/patients-families/ Contact  a doctor if: Your symptoms get worse. You get new symptoms. Get help right away if: You hurt yourself. You have serious thoughts about hurting yourself or others. You see, hear, taste, smell, or feel things that are not there. If you ever feel like you may hurt yourself or others, or have thoughts  about taking your own life, get help right away. Go to your nearest emergency department or: Call your local emergency services (911 in the [place] Call a suicide crisis helpline, such as the [facility] at 434-118-6762 or 988 in the [place]. This is open 24 hours a day in the [place] Text the Crisis Text Line at 904-001-1521 (in the [place].). Summary Major depressive disorder is a mental health condition. This disorder affects feelings. Symptoms of this condition last most of the day, almost every day, for 2 weeks. The symptoms of this disorder can cause problems with relationships and with daily activities. There are treatments and support for people who get this disorder. You may need more than one type of treatment. Get help right away if you have serious thoughts about hurting yourself or others. This information is not intended to replace advice given to you by your health care provider. Make sure you discuss any questions you have with your health care provider. Document Revised: 12/28/2020 Document Reviewed: 05/16/2019 Elsevier Patient Education  [place].

## 2022-04-09 NOTE — Assessment & Plan Note (Signed)
Refilled phentermine for weight control.  Advised patient that I will not be able to continue filling this medication as it is not the best way for weight loss/weight management.  Education provided to patient on alternatives that are better for healthy weight loss.  Patient is not ready at this time to stop phentermine.  Refill sent for 1 month once able to find other alternatives.

## 2022-04-09 NOTE — Telephone Encounter (Signed)
She is on a controlled substance, so she will need to stay with Je as her PCP.

## 2022-04-09 NOTE — Progress Notes (Signed)
Established Patient Office Visit  Subjective   [name], female    DOB: 1991-02-15  Age: 31 y.o. MRN: [id number]  Chief Complaint  Patient presents with   Medication Refill   Obesity    Pt states she does not want to come off the phentermine due to her depression    HPI  Depression, Follow-up  She  was last seen for this 6 months ago. Changes made at last visit include Pristiq 25 mg tablet by mouth daily.   She reports fair compliance with treatment. She is not having side effects.   She reports fair tolerance of treatment. Current symptoms include: depressed mood, difficulty concentrating, feelings of worthlessness/guilt, and hopelessness She feels she is Worse since last visit.     04/09/2022    4:09 PM 04/09/2022    3:59 PM 03/09/2022    3:33 PM  Depression screen PHQ 2/9  Decreased Interest _0 Down, Depressed, Hopeless _1 PHQ - 2 Score _2 Altered sleeping _3 Tired, decreased energy _4 Change in appetite _5 Feeling bad or failure about yourself  _6 Trouble concentrating _7 Moving slowly or fidgety/restless 2 0 0  Suicidal thoughts 1 0 0  PHQ-9 Score _8 Difficult doing work/chores Very difficult Somewhat difficult Extremely dIfficult    Anxiety, Follow-up  She was last seen for anxiety 6 months ago. Changes made at last visit include take BuSpar 15 mg tablet by mouth twice daily.   She reports fair compliance with treatment. She reports fair tolerance of treatment. She is not having side effects.   She feels her anxiety is severe and Worse since last visit.  Symptoms: No chest pain Yes difficulty concentrating  Yes dizziness Yes fatigue  Yes feelings of losing control No insomnia  Yes irritable Yes palpitations  Yes panic attacks Yes racing thoughts  No shortness of breath No sweating  No tremors/shakes    GAD-7 Results    04/09/2022    4:10 PM 04/09/2022    3:59 PM 03/09/2022    3:35 PM   GAD-7 Generalized Anxiety Disorder Screening Tool  1. Feeling Nervous, Anxious, or on Edge _9 2. Not Being Able to Stop or Control Worrying _10 3. Worrying Too Much About Different Things _11 4. Trouble Relaxing _12 5. Being So Restless it's Hard To Sit Still _13 6. Becoming Easily Annoyed or Irritable _14 7. Feeling Afraid As If Something Awful Might Happen 1 0 0  Total GAD-7 Score _15 Difficulty At Work, Home, or Getting  Along With Others? Very difficult Somewhat difficult Very difficult    PHQ-9 Scores    04/09/2022    4:09 PM 04/09/2022    3:59 PM 03/09/2022    3:33 PM  PHQ9 SCORE ONLY  PHQ-9 Total Score _16 Patient Active Problem List   Diagnosis Date Noted   Primary insomnia 02/09/2022   Obesity (BMI 30-39.9) 02/09/2022   Abnormal Pap smear of cervix 04/10/2021   Subclinical hyperthyroidism 11/27/2020   Generalized anxiety disorder 06/15/2020   Vitamin D insufficiency 01/26/2019   PTSD (post-traumatic stress disorder)    Chronic depression    History of drug abuse ([place])    Endometriosis    Past Medical  History:  Diagnosis Date   Abnormal TSH 01/26/2019   Allergy    Chronic depression    Endometriosis    stage 4    History of drug abuse ([facility])    Lumbar herniated disc    x3   PTSD (post-traumatic stress disorder)    Vitamin D insufficiency 01/26/2019   Past Surgical History:  Procedure Laterality Date   laproscopy     x3   LUMBAR EPIDURAL INJECTION     TONSILLECTOMY AND ADENOIDECTOMY     Social History   Tobacco Use   Smoking status: Former    Packs/day: 0.10    Types: Cigarettes    Quit date: 2020    Years since quitting: 3.8   Smokeless tobacco: Never   Tobacco comments:    maybe one a day   Vaping Use   Vaping Use: Every day  Substance Use Topics   Alcohol use: Not Currently   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   Sexual activity: Yes    Birth control/protection: Pill  Other Topics Concern   Not on file  Social History Narrative   Not on file   Social Determinants of Health   Financial Resource Strain: Medium Risk (04/03/2021)   Overall Financial Resource Strain (CARDIA)    Difficulty of Paying Living Expenses: Somewhat hard  Food Insecurity: No Food Insecurity (04/03/2021)   Hunger Vital Sign    Worried About Running Out of Food in the Last Year: Never true    Ran Out of Food in the Last Year: Never true  Transportation Needs: No Transportation Needs (04/03/2021)   PRAPARE - Hydrologist (Medical): No    Lack of Transportation (Non-Medical): No  Physical Activity: Insufficiently Active (04/03/2021)   Exercise Vital Sign    Days of Exercise per Week: 5 days    Minutes of Exercise per Session: 10 min  Stress: Stress Concern Present (04/03/2021)   [place]    Feeling of Stress : Rather much  Social Connections: Socially Isolated (04/03/2021)   Social Connection and Isolation Panel [NHANES]    Frequency of Communication with Friends and Family: More than three times a week    Frequency of Social Gatherings with Friends and Family: Once a week     Attends Religious Services: Never    Marine scientist or Organizations: No    Attends Archivist Meetings: Never    Marital Status: Never married  Intimate Partner Violence: Not At Risk (04/03/2021)  Humiliation, Afraid, Rape, and Kick questionnaire    Fear of Current or Ex-Partner: No    Emotionally Abused: No    Physically Abused: No    Sexually Abused: No   Family Status  Relation Name Status   Mother  Alive   Father  Alive   Sister 1/2 74   Sister twin Deceased   MGM  Deceased   MGF  Deceased   PGM  Deceased   PGF  Deceased   Family History  Problem Relation Age of Onset   Thyroid disease Mother    Hypertension Mother    Depression Mother    Vascular Disease Mother    Depression Father    Cancer Sister        ewings sarcoma   Heart attack Maternal Grandfather    COPD Paternal Grandfather    Heart disease Paternal Grandfather       Review of Systems  Constitutional: Negative.   HENT: Negative.    Eyes: Negative.   Respiratory: Negative.    Cardiovascular: Negative.   Gastrointestinal: Negative.   Genitourinary: Negative.   Skin: Negative.  Negative for itching and rash.  Psychiatric/Behavioral:  Positive for depression. The patient is nervous/anxious.   All other systems reviewed and are negative.     Objective:     BP 136/84   Pulse 79   Temp 98.6 F (37 C)   Ht _0  (1.778 m)   Wt 215 lb (97.5 kg)   LMP 04/02/2022 (Exact Date)   SpO2 99%   BMI 30.85 kg/m  Wt Readings from Last 3 Encounters:  04/09/22 215 lb (97.5 kg)  02/09/22 224 lb (101.6 kg)  01/11/22 229 lb (103.9 kg)      Physical Exam   No results found for any visits on 04/09/22.  Last CBC Lab Results  Component Value Date   WBC 10.0 10/30/2021   HGB 13.2 10/30/2021   HCT 38.1 10/30/2021   MCV 86 10/30/2021   MCH 29.7 10/30/2021   RDW 12.7 10/30/2021   PLT 317 06/10/4974   Last metabolic panel Lab Results  Component Value Date   GLUCOSE 85  10/30/2021   NA 138 10/30/2021   K 4.3 10/30/2021   CL 103 10/30/2021   CO2 23 10/30/2021   BUN 15 10/30/2021   CREATININE 0.75 10/30/2021   EGFR 110 10/30/2021   CALCIUM 10.1 10/30/2021   PROT 7.3 10/30/2021   ALBUMIN 4.8 10/30/2021   LABGLOB 2.5 10/30/2021   AGRATIO 1.9 10/30/2021   BILITOT <0.2 10/30/2021   ALKPHOS 98 10/30/2021   AST 16 10/30/2021   ALT 17 10/30/2021   Last lipids Lab Results  Component Value Date   CHOL 153 02/25/2020   HDL 33 (L) 02/25/2020   LDLCALC 93 02/25/2020   TRIG 154 (H) 02/25/2020   CHOLHDL 4.6 (H) 02/25/2020   Last hemoglobin A1c No results found for: "HGBA1C" Last thyroid functions Lab Results  Component Value Date   TSH 1.910 10/30/2021   T4TOTAL 8.8 02/25/2020   Last vitamin D Lab Results  Component Value Date   VD25OH 32.8 10/30/2021      The ASCVD Risk score (Arnett DK, et al., 2019) failed to calculate for the following reasons:   The 2019 ASCVD risk score is only valid for ages 90 to 32    Assessment & Plan:   Problem List Items Addressed This Visit       Other   Chronic depression - Primary  Completed PHQ-9.  Patient depression is not well controlled.  Referral to psych completed.  Increase Pristiq from 25 mg tablet to 50 mg tablet by mouth daily.  Follow-up in 6 weeks.      Relevant Medications   busPIRone (BUSPAR) 15 MG tablet   desvenlafaxine (PRISTIQ) 50 MG 24 hr tablet   Other Relevant Orders   Ambulatory referral to Psychiatry   Generalized anxiety disorder    Anxiety not well controlled Pristiq increased from 25 mg tablet by mouth daily to 50 mg tablet by mouth daily.  Completed psych referral.  GAD-7 completed.  Follow-up in 6 weeks.      Relevant Medications   busPIRone (BUSPAR) 15 MG tablet   desvenlafaxine (PRISTIQ) 50 MG 24 hr tablet   Other Relevant Orders   Ambulatory referral to Psychiatry   Obesity (BMI 30-39.9)    Refilled phentermine for weight control.  Advised patient that I will not  be able to continue filling this medication as it is not the best way for weight loss/weight management.  Education provided to patient on alternatives that are better for healthy weight loss.  Patient is not ready at this time to stop phentermine.  Refill sent for 1 month once able to find other alternatives.      Relevant Medications   phentermine (ADIPEX-P) 37.5 MG tablet    Return in about 6 weeks (around 05/21/2022) for depression /anxiety.    Ivy Lynn, NP

## 2022-04-09 NOTE — Assessment & Plan Note (Signed)
Completed PHQ-9.  Patient depression is not well controlled.  Referral to psych completed.  Increase Pristiq from 25 mg tablet to 50 mg tablet by mouth daily.  Follow-up in 6 weeks.

## 2022-04-09 NOTE — Telephone Encounter (Signed)
Pt wants to switch providers. She was one of Britneys patients and Britney recommended patient to establish care with Marjorie Smolder but patient says she couldn't get an appt with Tiffany in time before her meds ran out so she decided to schedule her appt with Je but didn't know that Ardeen Fillers was going to be listed as her provider if she did that.  Is Je and Jonelle Sidle ok with switch?

## 2022-04-10 NOTE — Telephone Encounter (Signed)
[email or web address]

## 2022-04-10 NOTE — Telephone Encounter (Signed)
Yes I am very okay, I am not her PCP, I only saw her for acute fill in  for [place]. Thank you

## 2022-04-11 ENCOUNTER — Other Ambulatory Visit: Payer: Self-pay | Admitting: Family Medicine

## 2022-04-11 DIAGNOSIS — E0590 Thyrotoxicosis, unspecified without thyrotoxic crisis or storm: Secondary | ICD-10-CM

## 2022-04-12 ENCOUNTER — Telehealth: Payer: Self-pay | Admitting: Nurse Practitioner

## 2022-04-13 ENCOUNTER — Other Ambulatory Visit: Payer: Self-pay | Admitting: Nurse Practitioner

## 2022-04-13 DIAGNOSIS — E669 Obesity, unspecified: Secondary | ICD-10-CM

## 2022-04-13 MED ORDER — PHENTERMINE HCL 37.5 MG PO TABS: 37.5000 mg | ORAL_TABLET | Freq: Every day | ORAL | 0 refills | Status: DC

## 2022-04-13 NOTE — Telephone Encounter (Signed)
Pt calling about his message again. Pt aware Ardeen Fillers has not addressed yet.

## 2022-04-18 NOTE — Telephone Encounter (Signed)
I spoke with Tiffany and she is willing to see patient but she will not write rx for phentermine. She also is in agreement with Je's plan of care. I have left message for patient to call back and ask for me to discuss.

## 2022-04-25 NOTE — Telephone Encounter (Signed)
Left message to call back  

## 2022-05-09 NOTE — Telephone Encounter (Signed)
Left message for patient to call back  

## 2022-05-09 NOTE — Telephone Encounter (Signed)
This encounter will be closed.

## 2022-05-14 ENCOUNTER — Other Ambulatory Visit: Payer: Self-pay | Admitting: *Deleted

## 2022-05-14 DIAGNOSIS — F5101 Primary insomnia: Secondary | ICD-10-CM

## 2022-05-14 MED ORDER — TEMAZEPAM 30 MG PO CAPS: 30.0000 mg | ORAL_CAPSULE | Freq: Every evening | ORAL | 0 refills | Status: DC | PRN

## 2022-05-23 ENCOUNTER — Ambulatory Visit
Admission: EM | Admit: 2022-05-23 | Discharge: 2022-05-23 | Disposition: A | Discharge status: Home / Self Care | Payer: 59 | Attending: Family Medicine | Admitting: Family Medicine

## 2022-05-23 ENCOUNTER — Encounter: Payer: Self-pay | Admitting: Emergency Medicine

## 2022-05-23 DIAGNOSIS — R1013 Epigastric pain: Secondary | ICD-10-CM

## 2022-05-23 DIAGNOSIS — L239 Allergic contact dermatitis, unspecified cause: Secondary | ICD-10-CM | POA: Diagnosis not present

## 2022-05-23 DIAGNOSIS — R110 Nausea: Secondary | ICD-10-CM

## 2022-05-23 MED ORDER — SUCRALFATE 1 G PO TABS: 1.0000 g | ORAL_TABLET | Freq: Three times a day (TID) | ORAL | 0 refills | Status: DC | PRN

## 2022-05-23 MED ORDER — METHYLPREDNISOLONE SODIUM SUCC 125 MG IJ SOLR
80.0000 mg | Freq: Once | INTRAMUSCULAR | Status: AC
  Administered 2022-05-23: 80 mg via INTRAMUSCULAR

## 2022-05-23 MED ORDER — ONDANSETRON 4 MG PO TBDP: 4.0000 mg | ORAL_TABLET | Freq: Three times a day (TID) | ORAL | 0 refills | Status: DC | PRN

## 2022-05-23 NOTE — ED Provider Notes (Signed)
[facility]    CSN: [id number] Arrival date & time: 05/23/22  1644      History   Chief Complaint Chief Complaint  Patient presents with   Rash    Recovering from illness and broke out in rash. Progressively getting worse. Still experiencing abdominal pain and fatigue - Entered by patient    HPI Elaine Lopez is a 31 y.o. female.   Patient presenting today with almost a week of upper abdominal pain, initially fatigue, vomiting and fever and now just nausea, epigastric pain and the fatigue.  She then started with a rash 2 days ago that itches and burns, started on the upper back and has spread now to chest and abdomen.  Denies known new exposures to anything in the environment, medications, foods but does have very sensitive skin and tends to react to certain detergents or other exposures.  Has tried some over-the-counter ointments with no relief.  Denies chest pain, shortness of breath, upper respiratory symptoms, vomiting, diarrhea, throat itching or swelling, difficulty breathing or swallowing.   Past Medical History:  Diagnosis Date   Abnormal TSH 01/26/2019   Allergy    Chronic depression    Endometriosis    stage 4    History of drug abuse (HCC)    Lumbar herniated disc    x3   PTSD (post-traumatic stress disorder)    Vitamin D insufficiency 01/26/2019    Patient Active Problem List   Diagnosis Date Noted   Primary insomnia 02/09/2022   Obesity (BMI 30-39.9) 02/09/2022   Abnormal Pap smear of cervix 04/10/2021   Subclinical hyperthyroidism 11/27/2020   Generalized anxiety disorder 06/15/2020   Vitamin D insufficiency 01/26/2019   PTSD (post-traumatic stress disorder)    Chronic depression    History of drug abuse (HCC)    Endometriosis     Past Surgical History:  Procedure Laterality Date   laproscopy     x3   LUMBAR EPIDURAL INJECTION     TONSILLECTOMY AND ADENOIDECTOMY      OB History     Gravida  0   Para  0   Term  0   Preterm  0    AB  0   Living  0      SAB  0   IAB  0   Ectopic  0   Multiple  0   Live Births  0            Home Medications    Prior to Admission medications   Medication Sig Start Date End Date Taking? Authorizing Provider  ondansetron (ZOFRAN-ODT) 4 MG disintegrating tablet Take 1 tablet (4 mg total) by mouth every 8 (eight) hours as needed for nausea or vomiting. 05/23/22  Yes Particia Nearing, PA-C  sucralfate (CARAFATE) 1 g tablet Take 1 tablet (1 g total) by mouth 3 (three) times daily as needed. Dissolve 1 tablet into a glass of water and drink up to 3 times daily as needed prior to meals 05/23/22  Yes Maurice March, Salley Hews, PA-C  busPIRone (BUSPAR) 15 MG tablet Take 1 tablet (15 mg total) by mouth 2 (two) times daily. 04/09/22   Daryll Drown, NP  cyanocobalamin (VITAMIN B12) 500 MCG tablet Take 500 mcg by mouth daily.    [provider]  desvenlafaxine (PRISTIQ) 50 MG 24 hr tablet Take 1 tablet (50 mg total) by mouth daily. 04/09/22   Daryll Drown, NP  gabapentin (NEURONTIN) 300 MG capsule Take 2 capsules (600  mg total) by mouth 3 (three) times daily as needed. 02/09/22   Gwenlyn Fudge, FNP  Melatonin 10 MG CAPS Take 2 capsules by mouth at bedtime.     [provider]  Omeprazole 20 MG TBEC Take 20 mg by mouth daily.    [provider]  phentermine (ADIPEX-P) 37.5 MG tablet Take 1 tablet (37.5 mg total) by mouth daily before breakfast. 04/13/22   Daryll Drown, NP  propranolol (INDERAL) 20 MG tablet Take 1 tablet by mouth twice daily 04/11/22   Daryll Drown, NP  temazepam (RESTORIL) 30 MG capsule Take 1 capsule (30 mg total) by mouth at bedtime as needed for sleep. 05/14/22   Daryll Drown, NP  VITAMIN D, CHOLECALCIFEROL, PO Take 1,000 Units by mouth daily.    [provider]    Family History Family History  Problem Relation Age of Onset   Thyroid disease Mother    Hypertension Mother    Depression Mother     Vascular Disease Mother    Depression Father    Cancer Sister        ewings sarcoma   Heart attack Maternal Grandfather    COPD Paternal Grandfather    Heart disease Paternal Grandfather     Social History Social History   Tobacco Use   Smoking status: Former    Packs/day: 0.10    Types: Cigarettes    Quit date: 2020    Years since quitting: 3.9   Smokeless tobacco: Never   Tobacco comments:    maybe one a day   Vaping Use   Vaping Use: Every day  Substance Use Topics   Alcohol use: Not Currently   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     Allergies   Patient has no known allergies.   Review of Systems Review of Systems HPI  Physical Exam Triage Vital Signs ED Triage Vitals [05/23/22 1852]  Enc Vitals Group     BP 133/83     Pulse Rate 84     Resp 18     Temp 98.4 F (36.9 C)     Temp Source Oral     SpO2 98 %     Weight      Height      Head Circumference      Peak Flow      Pain Score 0     Pain Loc      Pain Edu?      Excl. in GC?    No data found.  Updated Vital Signs BP 133/83 (BP Location: Right Arm)   Pulse 84   Temp 98.4 F (36.9 C) (Oral)   Resp 18   LMP 04/26/2022 (Approximate)   SpO2 98%   Visual Acuity Right Eye Distance:   Left Eye Distance:   Bilateral Distance:    Right Eye Near:   Left Eye Near:    Bilateral Near:     Physical Exam Vitals and nursing note reviewed.  Constitutional:      Appearance: Normal appearance. She is not ill-appearing.  HENT:     Head: Atraumatic.     Mouth/Throat:     Mouth: Mucous membranes are moist.  Eyes:     Extraocular Movements: Extraocular movements intact.     Conjunctiva/sclera: Conjunctivae normal.  Cardiovascular:     Rate and Rhythm: Normal rate and regular rhythm.  Heart sounds: Normal heart sounds.  Pulmonary:     Effort: Pulmonary effort is normal.     Breath sounds: Normal breath sounds.  Abdominal:      General: Bowel sounds are normal. There is no distension.     Palpations: Abdomen is soft.     Tenderness: There is no abdominal tenderness. There is no right CVA tenderness or guarding.  Musculoskeletal:        General: Normal range of motion.     Cervical back: Normal range of motion and neck supple.  Skin:    General: Skin is warm and dry.     Findings: Rash present.     Comments: Erythematous pinpoint papular rash widespread across entire back and abdomen  Neurological:     Mental Status: She is alert and oriented to person, place, and time.  Psychiatric:        Mood and Affect: Mood normal.        Thought Content: Thought content normal.        Judgment: Judgment normal.    UC Treatments / Results  Labs (all labs ordered are listed, but only abnormal results are displayed) Labs Reviewed - No data to display  EKG  Radiology No results found.  Procedures Procedures (including critical care time)  Medications Ordered in UC Medications  methylPREDNISolone sodium succinate (SOLU-MEDROL) 125 mg/2 mL injection 80 mg (80 mg Intramuscular Given 05/23/22 1926)    Initial Impression / Assessment and Plan / UC Course  I have reviewed the triage vital signs and the nursing notes.  Pertinent labs & imaging results that were available during my care of the patient were reviewed by me and considered in my medical decision making (see chart for details).     Will treat rash with IM Solu-Medrol to avoid further stomach irritation from prednisone.  She may also try over-the-counter creams, ointments, moisturizers and avoidance of any possible irritants regarding her ongoing epigastric pain and nausea, treat with Zofran, Carafate, bland diet, fluids.  Follow-up for worsening symptoms.  Work note given.  Final Clinical Impressions(s) / UC Diagnoses   Final diagnoses:  Allergic dermatitis  Abdominal pain, epigastric  Nausea   Discharge Instructions   None    ED Prescriptions      Medication Sig Dispense Auth. Provider   sucralfate (CARAFATE) 1 g tablet Take 1 tablet (1 g total) by mouth 3 (three) times daily as needed. Dissolve 1 tablet into a glass of water and drink up to 3 times daily as needed prior to meals 60 tablet Particia Nearing, PA-C   ondansetron (ZOFRAN-ODT) 4 MG disintegrating tablet Take 1 tablet (4 mg total) by mouth every 8 (eight) hours as needed for nausea or vomiting. 20 tablet Particia Nearing, [place]      PDMP not reviewed this encounter.   Particia Nearing, [place] 05/23/22 1943

## 2022-05-23 NOTE — ED Triage Notes (Signed)
Fatigue on Thursday night.  Vomiting on Friday with fever, states fever stopped Saturday night.  Woke up Monday with a rash that is itchy and burning. Rash started on upper back and spread to chest and sides.  States she continues to have a headache and fatigue.  Has been taking pepto and tylenol and ibuprofen to treat symptoms

## 2022-06-04 ENCOUNTER — Other Ambulatory Visit: Payer: Self-pay | Admitting: Nurse Practitioner

## 2022-06-04 ENCOUNTER — Other Ambulatory Visit: Payer: Self-pay | Admitting: Family Medicine

## 2022-06-04 DIAGNOSIS — E669 Obesity, unspecified: Secondary | ICD-10-CM

## 2022-06-04 DIAGNOSIS — E0590 Thyrotoxicosis, unspecified without thyrotoxic crisis or storm: Secondary | ICD-10-CM

## 2022-06-04 DIAGNOSIS — F5101 Primary insomnia: Secondary | ICD-10-CM

## 2022-06-04 MED ORDER — PROPRANOLOL HCL 20 MG PO TABS: 20.0000 mg | ORAL_TABLET | Freq: Two times a day (BID) | ORAL | 0 refills | Status: DC

## 2022-06-06 ENCOUNTER — Other Ambulatory Visit: Payer: Self-pay | Admitting: Nurse Practitioner

## 2022-06-06 DIAGNOSIS — F5101 Primary insomnia: Secondary | ICD-10-CM

## 2022-06-06 DIAGNOSIS — E669 Obesity, unspecified: Secondary | ICD-10-CM

## 2022-06-11 ENCOUNTER — Other Ambulatory Visit: Payer: Self-pay | Admitting: Nurse Practitioner

## 2022-06-11 DIAGNOSIS — E0590 Thyrotoxicosis, unspecified without thyrotoxic crisis or storm: Secondary | ICD-10-CM

## 2022-06-11 MED ORDER — PROPRANOLOL HCL 20 MG PO TABS: 20.0000 mg | ORAL_TABLET | Freq: Two times a day (BID) | ORAL | 0 refills | Status: DC

## 2022-06-11 NOTE — Telephone Encounter (Signed)
Patient on phentamine has to be seen in the the office every month. Please schedule OV.

## 2022-06-13 ENCOUNTER — Telehealth: Payer: 59 | Admitting: Nurse Practitioner

## 2022-06-19 ENCOUNTER — Encounter: Payer: Self-pay | Admitting: Nurse Practitioner

## 2022-06-19 ENCOUNTER — Ambulatory Visit: Payer: 59 | Admitting: Nurse Practitioner

## 2022-06-19 VITALS — BP 121/73 | HR 75 | Temp 98.8°F | Ht 70.0 in | Wt 212.0 lb

## 2022-06-19 DIAGNOSIS — Z0283 Encounter for blood-alcohol and blood-drug test: Secondary | ICD-10-CM | POA: Insufficient documentation

## 2022-06-19 DIAGNOSIS — Z6830 Body mass index (BMI) 30.0-30.9, adult: Secondary | ICD-10-CM

## 2022-06-19 DIAGNOSIS — Z713 Dietary counseling and surveillance: Secondary | ICD-10-CM | POA: Diagnosis not present

## 2022-06-19 DIAGNOSIS — R69 Illness, unspecified: Secondary | ICD-10-CM | POA: Diagnosis not present

## 2022-06-19 DIAGNOSIS — E669 Obesity, unspecified: Secondary | ICD-10-CM

## 2022-06-19 NOTE — Patient Instructions (Signed)
Here is a guide to help [place] find out which weight loss medications will be covered by your insurance plan.  Please check out this web site  [employer] and follow the 3 simple steps.   There is also a phone number you can call if you do not have access to the Internet. [phone] (Monday- Friday 8am-8pm)  [employer] provides coverage information for more than 80% of the inquiries submitted!!   Obesity, Adult Obesity is having too much body fat. Being obese means that your weight is more than what is healthy for you.  BMI (body mass index) is a number that explains how much body fat you have. If you have a BMI of 30 or more, you are obese. Obesity can cause serious health problems, such as: Stroke. Coronary artery disease (CAD). Type 2 diabetes. Some types of cancer. High blood pressure (hypertension). High cholesterol. Gallbladder stones. Obesity can also contribute to: Osteoarthritis. Sleep apnea. Infertility problems. What are the causes? Eating meals each day that are high in calories, sugar, and fat. Drinking a lot of drinks that have sugar in them. Being born with genes that may make you more likely to become obese. Having a medical condition that causes obesity. Taking certain medicines. Sitting a lot (having a sedentary lifestyle). Not getting enough sleep. What increases the risk? Having a family history of obesity. Living in an area with limited access to: [employer], recreation centers, or sidewalks. Healthy food choices, such as grocery stores and farmers' markets. What are the signs or symptoms? The main sign is having too much body fat. How is this treated? Treatment for this condition often includes changing your lifestyle. Treatment may include: Changing your diet. This may include making a healthy meal plan. Exercise. This may include activity that causes your heart to beat faster (aerobic exercise) and strength training. Work with your doctor to design a program  that works for you. Medicine to help you lose weight. This may be used if you are not able to lose one pound a week after 6 weeks of healthy eating and more exercise. Treating conditions that cause the obesity. Surgery. Options may include gastric banding and gastric bypass. This may be done if: Other treatments have not helped to improve your condition. You have a BMI of 40 or higher. You have life-threatening health problems related to obesity. Follow these instructions at home: Eating and drinking  Follow advice from your doctor about what to eat and drink. Your doctor may tell you to: Limit fast food, sweets, and processed snack foods. Choose low-fat options. For example, choose low-fat milk instead of whole milk. Eat five or more servings of fruits or vegetables each day. Eat at home more often. This gives you more control over what you eat. Choose healthy foods when you eat out. Learn to read food labels. This will help you learn how much food is in one serving. Keep low-fat snacks available. Avoid drinks that have a lot of sugar in them. These include soda, fruit juice, iced tea with sugar, and flavored milk. Drink enough water to keep your pee (urine) pale yellow. Do not go on fad diets. Physical activity Exercise often, as told by your doctor. Most adults should get up to 150 minutes of moderate-intensity exercise every week.Ask your doctor: What types of exercise are safe for you. How often you should exercise. Warm up and stretch before being active. Do slow stretching after being active (cool down). Rest between times of being active. Lifestyle  Work with your doctor and a Publishing rights manager (dietitian) to set a weight-loss goal that is best for you. Limit your screen time. Find ways to reward yourself that do not involve food. Do not drink alcohol if: Your doctor tells you not to drink. You are pregnant, may be pregnant, or are planning to become pregnant. If you drink  alcohol: Limit how much you have to: 0-1 drink a day for women. 0-2 drinks a day for men. Know how much alcohol is in your drink. In the [place] one drink equals one 12 oz bottle of beer (355 mL), one 5 oz glass of wine (148 mL), or one 1 oz glass of hard liquor (44 mL). General instructions Keep a weight-loss journal. This can help you keep track of: The food that you eat. How much exercise you get. Take over-the-counter and prescription medicines only as told by your doctor. Take vitamins and supplements only as told by your doctor. Think about joining a support group. Pay attention to your mental health as obesity can lead to depression or self esteem issues. Keep all follow-up visits. Contact a doctor if: You cannot meet your weight-loss goal after you have changed your diet and lifestyle for 6 weeks. You are having trouble breathing. Summary Obesity is having too much body fat. Being obese means that your weight is more than what is healthy for you. Work with your doctor to set a weight-loss goal. Get regular exercise as told by your doctor. This information is not intended to replace advice given to you by your health care provider. Make sure you discuss any questions you have with your health care provider. Document Revised: 01/10/2021 Document Reviewed: 01/10/2021 Elsevier Patient Education  [place].

## 2022-06-19 NOTE — Assessment & Plan Note (Signed)
Patient has been without phentamine for about 3 months. She is wanting to go back on mediation.  we discussed other forms of weight loss that will work best and safer for patient.  She verbalized understanding, phone number given to patient to call insurance to see what weight loss medication will be covered.

## 2022-06-19 NOTE — Assessment & Plan Note (Signed)
Completed drug screen with results pending

## 2022-06-19 NOTE — Progress Notes (Signed)
Established Patient Office Visit  Subjective   [name], female    DOB: Apr 18, 1991  Age: 32 y.o. MRN: [id number]  Chief Complaint  Patient presents with   Weight Loss   Obesity    Pt is wanting to start a lower dose of her medication     HPI Patient is in clinic today for weight loss management and refill of Adipex-p. Patient has been unable to losses weight. In the past patient has used Adipex-p with some therapeutic effect. Patient reports that medication helps her with energy and that she has not been on the medication for a few weeks to months.  No new concerns.      Patient Active Problem List   Diagnosis Date Noted   Encounter for drug screening 06/19/2022   Primary insomnia 02/09/2022   Obesity (BMI 30-39.9) 02/09/2022   Abnormal Pap smear of cervix 04/10/2021   Subclinical hyperthyroidism 11/27/2020   Generalized anxiety disorder 06/15/2020   Vitamin D insufficiency 01/26/2019   PTSD (post-traumatic stress disorder)    Chronic depression    History of drug abuse ([place])    Endometriosis    Past Medical History:  Diagnosis Date   Abnormal TSH 01/26/2019   Allergy    Chronic depression    Endometriosis    stage 4    History of drug abuse (HCC)    Lumbar herniated disc    x3   PTSD (post-traumatic stress disorder)    Vitamin D insufficiency 01/26/2019   Past Surgical History:  Procedure Laterality Date   laproscopy     x3   LUMBAR EPIDURAL INJECTION     TONSILLECTOMY AND ADENOIDECTOMY     Social History   Tobacco Use   Smoking status: Former    Packs/day: 0.10    Types: Cigarettes    Quit date: 2020    Years since quitting: 4.0   Smokeless tobacco: Never   Tobacco comments:    maybe one a day   Vaping Use   Vaping Use: Every day  Substance Use Topics   Alcohol use: Not Currently   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   Sexual activity: Yes    Birth control/protection: Pill  Other Topics Concern   Not on file  Social History Narrative   Not on file   Social Determinants of Health   Financial Resource Strain: Medium Risk (04/03/2021)   Overall Financial Resource Strain (CARDIA)    Difficulty of Paying Living Expenses: Somewhat hard  Food Insecurity: No Food Insecurity (04/03/2021)   Hunger Vital Sign  Worried About Charity fundraiser in the Last Year: Never true    [place] in the Last Year: Never true  Transportation Needs: No Transportation Needs (04/03/2021)   PRAPARE - Hydrologist (Medical): No    Lack of Transportation (Non-Medical): No  Physical Activity: Insufficiently Active (04/03/2021)   Exercise Vital Sign    Days of Exercise per Week: 5 days    Minutes of Exercise per Session: 10 min  Stress: Stress Concern Present (04/03/2021)   [place]    Feeling of Stress : Rather much  Social Connections: Socially Isolated (04/03/2021)   Social Connection and Isolation Panel [NHANES]    Frequency of Communication with Friends and Family: More than three times a week    Frequency of Social Gatherings with Friends and Family: Once a week    Attends Religious Services: Never     Marine scientist or Organizations: No    Attends Archivist Meetings: Never    Marital Status: Never married  Intimate Partner Violence: Not At Risk (04/03/2021)   Humiliation, Afraid, Rape, and Kick questionnaire    Fear of Current or Ex-Partner: No    Emotionally Abused: No    Physically Abused: No    Sexually Abused: No   Family Status  Relation Name Status   Mother  Alive   Father  Alive   Sister 1/2 74   Sister twin Deceased   MGM  Deceased   MGF  Deceased   PGM  Deceased   PGF  Deceased   Family History  Problem Relation Age of Onset   Thyroid disease Mother    Hypertension Mother    Depression Mother    Vascular Disease Mother    Depression Father    Cancer Sister        ewings sarcoma   Heart attack Maternal Grandfather    COPD Paternal Grandfather    Heart disease Paternal Grandfather    No Known Allergies    Review of Systems  Constitutional: Negative.  Negative for fever.  HENT: Negative.    Eyes: Negative.   Respiratory: Negative.    Cardiovascular: Negative.   Gastrointestinal: Negative.   Genitourinary: Negative.   Skin: Negative.  Negative for itching and rash.  All other systems reviewed and are negative.     Objective:     BP 121/73   Pulse 75   Temp 98.8 F (37.1 C)   Ht 5' 10" (1.778 m)   Wt 212 lb (96.2 kg)   LMP 05/27/2022 (Approximate)   SpO2 99%   BMI 30.42 kg/m  BP Readings from Last 3 Encounters:  06/19/22 121/73  05/23/22 133/83  04/09/22 136/84   Wt Readings from Last 3 Encounters:  06/19/22 212 lb (96.2 kg)  04/09/22 215 lb (97.5 kg)  02/09/22 224 lb (101.6 kg)      Physical Exam Vitals reviewed.  Constitutional:      Appearance: Normal appearance. She is obese.  HENT:     Head: Normocephalic.     Right Ear: External ear normal.     Left Ear: External ear normal.     Nose: Nose normal. No congestion.  Eyes:     Conjunctiva/sclera: Conjunctivae normal.     Pupils: Pupils are equal,  round, and reactive to light.  Cardiovascular:     Rate and Rhythm: Normal rate and regular rhythm.  Pulses: Normal pulses.     Heart sounds: Normal heart sounds.  Pulmonary:     Effort: Pulmonary effort is normal.     Breath sounds: Normal breath sounds.  Abdominal:     General: Bowel sounds are normal.  Skin:    General: Skin is warm.     Findings: No erythema or rash.  Neurological:     General: No focal deficit present.     Mental Status: She is alert and oriented to person, place, and time.  Psychiatric:        Mood and Affect: Mood normal.        Behavior: Behavior normal.      No results found for any visits on 06/19/22.  Last CBC Lab Results  Component Value Date   WBC 10.0 10/30/2021   HGB 13.2 10/30/2021   HCT 38.1 10/30/2021   MCV 86 10/30/2021   MCH 29.7 10/30/2021   RDW 12.7 10/30/2021   PLT 317 23/55/7322   Last metabolic panel Lab Results  Component Value Date   GLUCOSE 85 10/30/2021   NA 138 10/30/2021   K 4.3 10/30/2021   CL 103 10/30/2021   CO2 23 10/30/2021   BUN 15 10/30/2021   CREATININE 0.75 10/30/2021   EGFR 110 10/30/2021   CALCIUM 10.1 10/30/2021   PROT 7.3 10/30/2021   ALBUMIN 4.8 10/30/2021   LABGLOB 2.5 10/30/2021   AGRATIO 1.9 10/30/2021   BILITOT <0.2 10/30/2021   ALKPHOS 98 10/30/2021   AST 16 10/30/2021   ALT 17 10/30/2021   Last lipids Lab Results  Component Value Date   CHOL 153 02/25/2020   HDL 33 (L) 02/25/2020   LDLCALC 93 02/25/2020   TRIG 154 (H) 02/25/2020   CHOLHDL 4.6 (H) 02/25/2020   Last hemoglobin A1c No results found for: "HGBA1C"    The ASCVD Risk score (Arnett DK, et al., 2019) failed to calculate for the following reasons:   The 2019 ASCVD risk score is only valid for ages 72 to 28    Assessment & Plan:   Problem List Items Addressed This Visit       Other   Obesity (BMI 30-39.9)    Patient has been without phentamine for about 3 months. She is wanting to go back on mediation.  we  discussed other forms of weight loss that will work best and safer for patient.  She verbalized understanding, phone number given to patient to call insurance to see what weight loss medication will be covered.       Relevant Orders   Drug Screen 10 W/Conf, Serum   Encounter for drug screening - Primary    Completed drug screen with results pending      Relevant Orders   Drug Screen 10 W/Conf, Serum   BLOOD DRUG SCREEN 7 - PANEL   Drug Screen 10 W/Conf, Se    No follow-ups on file.    Ivy Lynn, NP

## 2022-06-21 LAB — DRUG SCREEN 10 W/CONF, SERUM
Amphetamines, IA: NEGATIVE ng/mL
Barbiturates, IA: NEGATIVE ug/mL
Benzodiazepines, IA: NEGATIVE ng/mL
Cocaine & Metabolite, IA: NEGATIVE ng/mL
Methadone, IA: NEGATIVE ng/mL
Opiates, IA: NEGATIVE ng/mL
Oxycodones, IA: NEGATIVE ng/mL
Phencyclidine, IA: NEGATIVE ng/mL
Propoxyphene, IA: NEGATIVE ng/mL
THC(Marijuana) Metabolite, IA: NEGATIVE ng/mL

## 2022-06-22 ENCOUNTER — Ambulatory Visit: Payer: 59 | Admitting: Nurse Practitioner

## 2022-06-22 NOTE — Progress Notes (Signed)
If I refill medication no provider in the building will prescribe it so its not fair on her she may have to find another clinic

## 2022-07-03 ENCOUNTER — Other Ambulatory Visit: Payer: Self-pay | Admitting: Nurse Practitioner

## 2022-07-03 DIAGNOSIS — F5101 Primary insomnia: Secondary | ICD-10-CM

## 2022-07-13 ENCOUNTER — Other Ambulatory Visit: Payer: Self-pay | Admitting: Family Medicine

## 2022-07-13 DIAGNOSIS — F5101 Primary insomnia: Secondary | ICD-10-CM

## 2022-07-16 ENCOUNTER — Other Ambulatory Visit: Payer: Self-pay | Admitting: Family Medicine

## 2022-07-16 DIAGNOSIS — F5101 Primary insomnia: Secondary | ICD-10-CM

## 2022-07-16 NOTE — Telephone Encounter (Signed)
Regarding her Temazepam, pt did her toxasure at her 06/19/22 visit, it is noted results as expected w/ note back to PCP at the time if she could get refill, last RF was 05/14/22 Next OV is 08/09/22

## 2022-07-16 NOTE — Telephone Encounter (Signed)
  Prescription Request  07/16/2022  Is this a "Controlled Substance" medicine? yes  Have you seen your PCP in the last 2 weeks? no  If YES, route message to pool  -  If NO, patient needs to be scheduled for appointment.  What is the name of the medication or equipment? Temazepam 30 mg  Have you contacted your pharmacy to request a refill? Yes   Which pharmacy would you like this sent to? [facility] in [place]   Patient notified that their request is being sent to the clinical staff for review and that they should receive a response within 2 business days.

## 2022-07-18 MED ORDER — TEMAZEPAM 30 MG PO CAPS: 30.0000 mg | ORAL_CAPSULE | Freq: Every evening | ORAL | 0 refills | Status: DC | PRN

## 2022-07-19 NOTE — Telephone Encounter (Signed)
LMOVM refill sent to pharmacy yesterday

## 2022-07-21 ENCOUNTER — Encounter (HOSPITAL_COMMUNITY): Payer: Self-pay | Admitting: *Deleted

## 2022-07-21 ENCOUNTER — Other Ambulatory Visit: Payer: Self-pay

## 2022-07-21 ENCOUNTER — Emergency Department (HOSPITAL_COMMUNITY): Payer: 59

## 2022-07-21 ENCOUNTER — Emergency Department (HOSPITAL_COMMUNITY)
Admission: EM | Admit: 2022-07-21 | Discharge: 2022-07-21 | Disposition: A | Discharge status: Home / Self Care | Payer: 59 | Attending: Student | Admitting: Student

## 2022-07-21 DIAGNOSIS — R102 Pelvic and perineal pain: Secondary | ICD-10-CM | POA: Diagnosis not present

## 2022-07-21 DIAGNOSIS — N739 Female pelvic inflammatory disease, unspecified: Secondary | ICD-10-CM | POA: Insufficient documentation

## 2022-07-21 DIAGNOSIS — Z3A01 Less than 8 weeks gestation of pregnancy: Secondary | ICD-10-CM | POA: Diagnosis not present

## 2022-07-21 DIAGNOSIS — O26891 Other specified pregnancy related conditions, first trimester: Secondary | ICD-10-CM | POA: Diagnosis not present

## 2022-07-21 DIAGNOSIS — Z3490 Encounter for supervision of normal pregnancy, unspecified, unspecified trimester: Secondary | ICD-10-CM

## 2022-07-21 DIAGNOSIS — R1031 Right lower quadrant pain: Secondary | ICD-10-CM | POA: Diagnosis not present

## 2022-07-21 HISTORY — DX: Unspecified ovarian cyst, unspecified side: N83.209

## 2022-07-21 LAB — COMPREHENSIVE METABOLIC PANEL
ALT: 21 U/L (ref 0–44)
AST: 22 U/L (ref 15–41)
Albumin: 4.1 g/dL (ref 3.5–5.0)
Alkaline Phosphatase: 78 U/L (ref 38–126)
Anion gap: 8 (ref 5–15)
BUN: 9 mg/dL (ref 6–20)
CO2: 23 mmol/L (ref 22–32)
Calcium: 8.8 mg/dL — ABNORMAL LOW (ref 8.9–10.3)
Chloride: 105 mmol/L (ref 98–111)
Creatinine, Ser: 0.58 mg/dL (ref 0.44–1.00)
GFR, Estimated: >60 mL/min (ref >60)
Glucose, Bld: 86 mg/dL (ref 70–99)
Potassium: 3.6 mmol/L (ref 3.5–5.1)
Sodium: 136 mmol/L (ref 135–145)
Total Bilirubin: 0.4 mg/dL (ref 0.3–1.2)
Total Protein: 7.2 g/dL (ref 6.5–8.1)

## 2022-07-21 LAB — CBC WITH DIFFERENTIAL/PLATELET
Abs Immature Granulocytes: 0.01 10*3/uL (ref 0.00–0.07)
Basophils Absolute: 0 10*3/uL (ref 0.0–0.1)
Basophils Relative: 0 %
Eosinophils Absolute: 0.1 10*3/uL (ref 0.0–0.5)
Eosinophils Relative: 1 %
HCT: 36 % (ref 36.0–46.0)
Hemoglobin: 12.1 g/dL (ref 12.0–15.0)
Immature Granulocytes: 0 %
Lymphocytes Relative: 25 %
Lymphs Abs: 2.3 10*3/uL (ref 0.7–4.0)
MCH: 30 pg (ref 26.0–34.0)
MCHC: 33.6 g/dL (ref 30.0–36.0)
MCV: 89.3 fL (ref 80.0–100.0)
Monocytes Absolute: 0.6 10*3/uL (ref 0.1–1.0)
Monocytes Relative: 7 %
Neutro Abs: 6.1 10*3/uL (ref 1.7–7.7)
Neutrophils Relative %: 67 %
Platelets: 296 10*3/uL (ref 150–400)
RBC: 4.03 MIL/uL (ref 3.87–5.11)
RDW: 12.4 % (ref 11.5–15.5)
WBC: 9.2 10*3/uL (ref 4.0–10.5)
nRBC: 0 % (ref 0.0–0.2)

## 2022-07-21 LAB — URINALYSIS, ROUTINE W REFLEX MICROSCOPIC
Bilirubin Urine: NEGATIVE
Glucose, UA: NEGATIVE mg/dL
Hgb urine dipstick: NEGATIVE
Ketones, ur: NEGATIVE mg/dL
Leukocytes,Ua: NEGATIVE
Nitrite: NEGATIVE
Protein, ur: NEGATIVE mg/dL
Specific Gravity, Urine: 1.008 (ref 1.005–1.030)
pH: 5 (ref 5.0–8.0)

## 2022-07-21 LAB — WET PREP, GENITAL
Clue Cells Wet Prep HPF POC: NONE SEEN
Sperm: NONE SEEN
Trich, Wet Prep: NONE SEEN
WBC, Wet Prep HPF POC: >=10 — AB (ref <10)
Yeast Wet Prep HPF POC: NONE SEEN

## 2022-07-21 LAB — LIPASE, BLOOD: Lipase: 36 U/L (ref 11–51)

## 2022-07-21 LAB — I-STAT BETA HCG BLOOD, ED (MC, WL, AP ONLY): I-stat hCG, quantitative: 1076.9 m[IU]/mL — ABNORMAL HIGH (ref <5)

## 2022-07-21 MED ORDER — AZITHROMYCIN 250 MG PO TABS
1000.0000 mg | ORAL_TABLET | Freq: Once | ORAL | Status: AC
  Administered 2022-07-21: 1000 mg via ORAL
  Filled 2022-07-21: qty 4

## 2022-07-21 MED ORDER — ACETAMINOPHEN 325 MG PO TABS
650.0000 mg | ORAL_TABLET | Freq: Once | ORAL | Status: AC
  Administered 2022-07-21: 650 mg via ORAL
  Filled 2022-07-21: qty 2

## 2022-07-21 MED ORDER — LIDOCAINE HCL (PF) 1 % IJ SOLN
1.0000 mL | Freq: Once | INTRAMUSCULAR | Status: AC
  Administered 2022-07-21: 2.1 mL
  Filled 2022-07-21: qty 5

## 2022-07-21 MED ORDER — METRONIDAZOLE 500 MG PO TABS
500.0000 mg | ORAL_TABLET | Freq: Once | ORAL | Status: AC
  Administered 2022-07-21: 500 mg via ORAL
  Filled 2022-07-21: qty 1

## 2022-07-21 MED ORDER — CEFTRIAXONE SODIUM 500 MG IJ SOLR
500.0000 mg | Freq: Once | INTRAMUSCULAR | Status: AC
  Administered 2022-07-21: 500 mg via INTRAMUSCULAR
  Filled 2022-07-21: qty 500

## 2022-07-21 MED ORDER — METRONIDAZOLE 500 MG PO TABS: 500.0000 mg | ORAL_TABLET | Freq: Two times a day (BID) | ORAL | 0 refills | Status: AC

## 2022-07-21 NOTE — ED Provider Notes (Signed)
[facility] Provider Note   CSN: [id number] Arrival date & time: 07/21/22  1716     History  Chief Complaint  Patient presents with   Abdominal Pain    Elaine Lopez is a 32 y.o. female who presents to the ED with concerns for RLQ abdominal pain onset 5 days. Notes that she has a history of endometriosis and ovarian cyst.  Took a pregnancy test at home today that was positive.  Also voices concern for sciatic pain on the left.  Denies nausea, vomiting, diarrhea, vaginal discharge, vaginal bleeding, constipation.  The history is provided by the patient. No language interpreter was used.       Home Medications Prior to Admission medications   Medication Sig Start Date End Date Taking? Authorizing Provider  metroNIDAZOLE (FLAGYL) 500 MG tablet Take 1 tablet (500 mg total) by mouth 2 (two) times daily for 14 days. 07/21/22 08/04/22 Yes Nyjae Hodge A, PA-C  busPIRone (BUSPAR) 15 MG tablet Take 1 tablet (15 mg total) by mouth 2 (two) times daily. 04/09/22   Ivy Lynn, NP  cyanocobalamin (VITAMIN B12) 500 MCG tablet Take 500 mcg by mouth daily.    [provider]  desvenlafaxine (PRISTIQ) 50 MG 24 hr tablet Take 1 tablet (50 mg total) by mouth daily. 04/09/22   Ivy Lynn, NP  gabapentin (NEURONTIN) 300 MG capsule Take 2 capsules (600 mg total) by mouth 3 (three) times daily as needed. 02/09/22   Loman Brooklyn, FNP  Melatonin 10 MG CAPS Take 2 capsules by mouth at bedtime.     [provider]  Omeprazole 20 MG TBEC Take 20 mg by mouth daily.    [provider]  ondansetron (ZOFRAN-ODT) 4 MG disintegrating tablet Take 1 tablet (4 mg total) by mouth every 8 (eight) hours as needed for nausea or vomiting. 05/23/22   Volney American, PA-C  phentermine (ADIPEX-P) 37.5 MG tablet Take 1 tablet (37.5 mg total) by mouth daily before breakfast. Patient not taking: Reported on 06/19/2022 04/13/22   Ivy Lynn, NP  propranolol (INDERAL) 20 MG tablet Take 1 tablet (20 mg total) by mouth 2 (two) times daily. 06/11/22   Ivy Lynn, NP  sucralfate (CARAFATE) 1 g tablet Take 1 tablet (1 g total) by mouth 3 (three) times daily as needed. Dissolve 1 tablet into a glass of water and drink up to 3 times daily as needed prior to meals 05/23/22   Volney American, PA-C  temazepam (RESTORIL) 30 MG capsule Take 1 capsule (30 mg total) by mouth at bedtime as needed for sleep. 07/18/22   Gwenlyn Perking, FNP  VITAMIN D, CHOLECALCIFEROL, PO Take 1,000 Units by mouth daily.    [provider]      Allergies    Patient has no known allergies.    Review of Systems   Review of Systems  Gastrointestinal:  Positive for abdominal pain.  All other systems reviewed and are negative.   Physical Exam Updated Vital Signs BP (!) 129/90 (BP Location: Right Arm)   Pulse 93   Temp 97.7 F (36.5 C) (Oral)   Resp 16   Ht 5\' 10"  (1.778 m)   Wt 95.3 kg   LMP 06/23/2022 (Approximate) Comment: + home pregancy test today  SpO2 100%   BMI 30.13 kg/m  Physical Exam Vitals and nursing note reviewed. Exam conducted with a chaperone present.  Constitutional:      General:  She is not in acute distress.    Appearance: Normal appearance. She is not ill-appearing, toxic-appearing or diaphoretic.  HENT:     Head: Normocephalic and atraumatic.     Right Ear: External ear normal.     Left Ear: External ear normal.     Mouth/Throat:     Pharynx: No oropharyngeal exudate.  Eyes:     General: No scleral icterus.    Extraocular Movements: Extraocular movements intact.     Conjunctiva/sclera: Conjunctivae normal.  Cardiovascular:     Rate and Rhythm: Normal rate and regular rhythm.     Pulses: Normal pulses.     Heart sounds: Normal heart sounds.  Pulmonary:     Effort: Pulmonary effort is normal. No respiratory distress.     Breath sounds: Normal breath sounds. No wheezing.  Abdominal:      General: Abdomen is flat. Bowel sounds are normal. There is no distension.     Palpations: Abdomen is soft. There is no mass.     Tenderness: There is no abdominal tenderness. There is no guarding or rebound.     Hernia: There is no hernia in the left inguinal area or right inguinal area.  Genitourinary:    Pubic Area: No rash.      Labia:        Right: No rash, tenderness, lesion or injury.        Left: No rash, tenderness, lesion or injury.      Vagina: No signs of injury and foreign body. Vaginal discharge (yellow-white) present. No erythema, tenderness or bleeding.     Cervix: Erythema present.     Uterus: Normal. Not deviated, not enlarged, not fixed and not tender.      Adnexa: Right adnexa normal and left adnexa normal.     Comments: RN chaperone present for exam.  Yellow-white discharge noted to vaginal vault and cervix.  Erythematous cervix. Tenderness to palpation noted on exam.  Unable to perform digital exam due to significant pain at the introitus and canal.  Musculoskeletal:        General: Normal range of motion.     Cervical back: Normal range of motion and neck supple.  Lymphadenopathy:     Lower Body: No right inguinal adenopathy. No left inguinal adenopathy.  Skin:    General: Skin is warm and dry.  Neurological:     Mental Status: She is alert.  Psychiatric:        Behavior: Behavior normal.     ED Results / Procedures / Treatments   Labs (all labs ordered are listed, but only abnormal results are displayed) Labs Reviewed  WET PREP, GENITAL - Abnormal; Notable for the following components:      Result Value   WBC, Wet Prep HPF POC >=10 (*)    All other components within normal limits  COMPREHENSIVE METABOLIC PANEL - Abnormal; Notable for the following components:   Calcium 8.8 (*)    All other components within normal limits  I-STAT BETA HCG BLOOD, ED ([facility], [facility], [facility] ONLY) - Abnormal; Notable for the following components:   I-stat hCG, quantitative 1,076.9 (*)     All other components within normal limits  CBC WITH DIFFERENTIAL/PLATELET  LIPASE, BLOOD  URINALYSIS, ROUTINE W REFLEX MICROSCOPIC  GC/CHLAMYDIA PROBE AMP ([place]) NOT AT [facility]    EKG None  Radiology US OB Comp < 14 Wks  Result Date: 07/21/2022 CLINICAL DATA:  Intermittent right lower quadrant pain. EXAM: OBSTETRIC <14 WK [place] AND TRANSVAGINAL  OB US TECHNIQUE: Both transabdominal and transvaginal ultrasound examinations were performed for complete evaluation of the gestation as well as the maternal uterus, adnexal regions, and pelvic cul-de-sac. Transvaginal technique was performed to assess early pregnancy. COMPARISON:  None Available. FINDINGS: Intrauterine gestational sac: Single Yolk sac:  Not Visualized. Embryo:  Not Visualized. Cardiac Activity: Not Visualized. MSD: 0.32 mm   5 w   0 d Subchorionic hemorrhage:  None visualized. Maternal uterus/adnexae: Bicornuate uterus. The tiny hypoechoic structure which may represent an early gestational sac is located in the right horn of the uterus. The uterus measures 8.7 x 5.0 x 7.1 cm for a volume of 161 cc. The right ovary measures 1.4 x 2.5 x 1.6 cm for a volume of 2.9 cc. The left ovary measures 1.7 x 2.2 x 1.9 cm for volume of 3.8 cc. There is no sonographic evidence of ectopic pregnancy. Normal arterial and venous blood flow to both ovaries. 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 Probable early intrauterine gestational sac, but no yolk sac, fetal pole, or cardiac activity yet visualized. Recommend follow-up quantitative B-HCG levels and follow-up US in 14 days to assess viability. This recommendation follows SRU consensus guidelines: Diagnostic Criteria for Nonviable Pregnancy Early in the First Trimester. [place] [employer] Med 2013; 194:1740-81. No sonographic evidence of ectopic pregnancy on this exam. Electronically Signed   By: Fidela Salisbury M.D.   On: 07/21/2022 20:34   US OB  Transvaginal  Result Date: 07/21/2022 CLINICAL DATA:  Intermittent right lower quadrant pain. EXAM: OBSTETRIC <14 WK [place] AND TRANSVAGINAL OB US TECHNIQUE: Both transabdominal and transvaginal ultrasound examinations were performed for complete evaluation of the gestation as well as the maternal uterus, adnexal regions, and pelvic cul-de-sac. Transvaginal technique was performed to assess early pregnancy. COMPARISON:  None Available. FINDINGS: Intrauterine gestational sac: Single Yolk sac:  Not Visualized. Embryo:  Not Visualized. Cardiac Activity: Not Visualized. MSD: 0.32 mm   5 w   0 d Subchorionic hemorrhage:  None visualized. Maternal uterus/adnexae: Bicornuate uterus. The tiny hypoechoic structure which may represent an early gestational sac is located in the right horn of the uterus. The uterus measures 8.7 x 5.0 x 7.1 cm for a volume of 161 cc. The right ovary measures 1.4 x 2.5 x 1.6 cm for a volume of 2.9 cc. The left ovary measures 1.7 x 2.2 x 1.9 cm for volume of 3.8 cc. There is no sonographic evidence of ectopic pregnancy. Normal arterial and venous blood flow to both ovaries. 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 Probable early intrauterine gestational sac, but no yolk sac, fetal pole, or cardiac activity yet visualized. Recommend follow-up quantitative B-HCG levels and follow-up US in 14 days to assess viability. This recommendation follows SRU consensus guidelines: Diagnostic Criteria for Nonviable Pregnancy Early in the First Trimester. [employer] Med 2013; 448:1856-31. No sonographic evidence of ectopic pregnancy on this exam. Electronically Signed   By: Fidela Salisbury M.D.   On: 07/21/2022 20:34   US PELVIC DOPPLER (TORSION R/O OR MASS ARTERIAL FLOW)  Result Date: 07/21/2022 CLINICAL DATA:  Intermittent right lower quadrant pain. EXAM: OBSTETRIC <14 WK [place] AND TRANSVAGINAL OB US TECHNIQUE: Both transabdominal and transvaginal  ultrasound examinations were performed for complete evaluation of the gestation as well as the maternal uterus, adnexal regions, and pelvic cul-de-sac. Transvaginal technique was performed to assess early pregnancy. COMPARISON:  None Available. FINDINGS: Intrauterine gestational sac:  Single Yolk sac:  Not Visualized. Embryo:  Not Visualized. Cardiac Activity: Not Visualized. MSD: 0.32 mm   5 w   0 d Subchorionic hemorrhage:  None visualized. Maternal uterus/adnexae: Bicornuate uterus. The tiny hypoechoic structure which may represent an early gestational sac is located in the right horn of the uterus. The uterus measures 8.7 x 5.0 x 7.1 cm for a volume of 161 cc. The right ovary measures 1.4 x 2.5 x 1.6 cm for a volume of 2.9 cc. The left ovary measures 1.7 x 2.2 x 1.9 cm for volume of 3.8 cc. There is no sonographic evidence of ectopic pregnancy. Normal arterial and venous blood flow to both ovaries. 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 Probable early intrauterine gestational sac, but no yolk sac, fetal pole, or cardiac activity yet visualized. Recommend follow-up quantitative B-HCG levels and follow-up US in 14 days to assess viability. This recommendation follows SRU consensus guidelines: Diagnostic Criteria for Nonviable Pregnancy Early in the First Trimester. [employer] Med 2013; 188:4166-06. No sonographic evidence of ectopic pregnancy on this exam. Electronically Signed   By: Fidela Salisbury M.D.   On: 07/21/2022 20:34    Procedures Procedures    Medications Ordered in ED Medications  acetaminophen (TYLENOL) tablet 650 mg (has no administration in time range)  azithromycin (ZITHROMAX) tablet 1,000 mg (has no administration in time range)  cefTRIAXone (ROCEPHIN) injection 500 mg (has no administration in time range)  lidocaine (PF) (XYLOCAINE) 1 % injection 1-2.1 mL (has no administration in time range)  metroNIDAZOLE (FLAGYL) tablet  500 mg (has no administration in time range)    ED Course/ Medical Decision Making/ A&P Clinical Course as of 07/21/22 2237  Sat Jul 21, 2022  1848 I-stat hCG, quantitative(!): 1,076.9 [SB]  2211 Discussed with patient results of ultrasound and labs.  Discussed with patient discharge treatment plan.  Answered all available questions.  Patient appears safe for discharge at this time. [SB]  2217 Discussed with Nira Conn ([place]) who notes that Flagyl is safe in pregnancy to treat for pelvic inflammatory disease. [SB]    Clinical Course User Index [SB] Judee Hennick A, PA-C                             Medical Decision Making Amount and/or Complexity of Data Reviewed Labs: ordered. Decision-making details documented in ED Course. Radiology: ordered.  Risk OTC drugs. Prescription drug management.   Patient presents to the emergency department with right lower quadrant abdominal pain onset 5 days.  History of positive pregnancy test today.  Sexually active with 1 partner.  On exam patient with right lower quadrant abdominal tenderness to palpation.  RN chaperone present for GU exam.  Yellow-white discharge noted to vaginal vault and cervix.  Erythematous cervix.  Tenderness to palpation noted on exam.  Unable to perform digital exam due to significant pain at the introitus and canal.  Differential diagnosis includes pelvic inflammatory disease, ovarian torsion, ovarian cyst, ectopic pregnancy, endometriosis.  Labs:  I ordered, and personally interpreted labs.  The pertinent results include:  Wet prep notable for greater than 10 WBC otherwise negative. Urinalysis negative. Gonorrhea and chlamydia ordered results pending at time of discharge. CBC without leukocytosis. CMP unremarkable.    Imaging: I ordered imaging studies including ultrasound OB, ultrasound pelvic Doppler I independently visualized and interpreted imaging which showed  Possible early intrauterine gestational  sac in the  right horn of a  bicornuate uterus. Probable early intrauterine gestational sac, but  no yolk sac, fetal pole, or cardiac activity yet visualized.  No sonographic evidence of ectopic pregnancy on this exam.   I agree with the radiologist interpretation  Medications:  I ordered medication including Tylenol, Zithromax, Rocephin, Flagyl for symptom management and antibiotic treatment for pelvic inflammatory disease.  I have reviewed the patients home medicines and have made adjustments as needed    Disposition: Presentation suspicious for pelvic inflammatory disease.  Also notable for early pregnancy.  Doubt concerns at this time for ovarian torsion, ovarian cyst, ectopic pregnancy, endometriosis. After consideration of the diagnostic results and the patients response to treatment, I feel that the patient would benefit from Discharge home.  Patient discharged home with information for several OB/GYN's in the area for follow-up in 14 days for hCG and ultrasound.  Provided with prescription for Flagyl for completion of treatment of pelvic inflammatory disease.  Supportive care measures and strict return precautions discussed with patient at bedside. Pt acknowledges and verbalizes understanding. Pt appears safe for discharge. Follow up as indicated in discharge paperwork.   This chart was dictated using voice recognition software, Dragon. Despite the best efforts of this provider to proofread and correct errors, errors may still occur which can change documentation meaning.    Final Clinical Impression(s) / ED Diagnoses Final diagnoses:  Pelvic pain in female  Pelvic inflammatory disease  Early stage of pregnancy    Rx / DC Orders ED Discharge Orders          Ordered    metroNIDAZOLE (FLAGYL) 500 MG tablet  2 times daily        07/21/22 2220              Sanaai Doane A, PA-C 07/21/22 2237    Glendora Score, MD 07/22/22 1554

## 2022-07-21 NOTE — ED Triage Notes (Signed)
Pt with RLQ stabbing/burning pain since Tuesday.  Radiates to vagina.  + pregnancy test done at home today. Denies any N/V/D. Denies any vaginal discharge or bleeding. Pt with sciatica pain on left that radiates down left leg as well. Hx of ovarian cyst and stage 4 endometriosis.  Has tried tylenol and ibuprofen.

## 2022-07-21 NOTE — ED Notes (Signed)
Abd pain provoked by position. Worse with bending forward. Pt reports positive pregnancy test at home.

## 2022-07-21 NOTE — Discharge Instructions (Addendum)
It was a pleasure take care of you today!  Your labs did not show any concerning emergent findings today.  Your ultrasound showed possible early pregnancy.  Your hCG today was elevated at 1076.9.  It is important that you follow-up in 2 weeks for repeat hCG and ultrasound with the OB/GYN specialist.  Attached are options for several OB/GYN's in the area, you may go to whichever one is closest to you for your follow-up appointment in 2 weeks for your repeat hCG and ultrasound.  Begin taking over-the-counter prenatal vitamins.  Do not take any NSAIDs.  If you are experiencing pain you may take over-the-counter Tylenol for pain.  You may follow-up with your primary care provider as needed.  We sent a prescription for Flagyl, ensure that you complete the entire prescription.  Return to the emergency department if you are experiencing increasing/worsening abdominal pain, nausea, vomiting, vaginal bleeding, worsening symptoms.

## 2022-07-21 NOTE — ED Notes (Signed)
Pt to US at this time.

## 2022-07-23 LAB — GC/CHLAMYDIA PROBE AMP (~~LOC~~) NOT AT ARMC
Chlamydia: NEGATIVE
Comment: NEGATIVE
Comment: NORMAL
Neisseria Gonorrhea: NEGATIVE

## 2022-07-24 ENCOUNTER — Telehealth: Payer: Self-pay | Admitting: *Deleted

## 2022-07-24 ENCOUNTER — Encounter: Payer: Self-pay | Admitting: *Deleted

## 2022-07-24 NOTE — Patient Outreach (Signed)
  Care Coordination [facility] Note ED EMMI Alert Transition Care Management Follow-up Telephone Call Date of discharge and from where: [facility] on 07/21/22 How have you been since you were released from the hospital? Pain level is the same. Continues to have constant RLQ that is intermittently worse. Today noticed some brown vaginal discharge with small amount of dark red blood.  Any questions or concerns? No  Items Reviewed: Did the pt receive and understand the discharge instructions provided? Yes  Medications obtained and verified? Yes  Other? Yes . Reviewed and discussed imaging reports and lab results from ED visit. Both indicate early pregnancy. Per patient, this is her first pregnancy. Imaging was reassuring that this is not an ectopic pregnancy. Wet prep, GC/chlamydia, and U/A were negative. Patient treated for pelvic inflammatory disease. Pain and bleeding could be related to PID, normal variant of early pregnancy, or miscarriage. Pain could also have a non-gynecological cause that wasn't explored. Offered to assist with scheduling an appt with PCP or alternatively she can call [facility] to see if she can get an appt sooner than her scheduled appt for 08/06/22. Patient would like to wait until tomorrow and if the bleeding persists she will reach out to PCP or OB for an appt. Advised that there is someone on-call for [facility] after-hours if needed as well. If bleeding or pain worsens, advised to seek medical attention. If soaking through a pad in 1-2 hours, emergency medical attention may be needed. Encouraged to follow-up with either PCP or OBGYN tomorrow to schedule a sooner appt. Any new allergies since your discharge? No  Dietary orders reviewed? No Do you have support at home? Yes   Home Care and Equipment/Supplies: Were home health services ordered? no If so, what is the name of the agency?   Has the agency set up a time to come to the patient's home? no Were any new  equipment or medical supplies ordered?  No What is the name of the medical supply agency?  Were you able to get the supplies/equipment? not applicable Do you have any questions related to the use of the equipment or supplies? No  Functional Questionnaire: (I = Independent and D = Dependent) ADLs: I  Bathing/Dressing- I  Meal Prep- I  Eating- I  Maintaining continence- I  Transferring/Ambulation- I  Managing Meds- I  Follow up appointments reviewed:  PCP Hospital f/u appt confirmed?  [facility] f/u appt confirmed? Yes  Scheduled to see [facility] on 08/06/22 and 08/13/22. Are transportation arrangements needed? No  If their condition worsens, is the pt aware to call PCP or go to the Emergency Dept.? Yes Was the patient provided with contact information for the PCP's office or ED? Yes Was to pt encouraged to call back with questions or concerns? Yes  SDOH assessments and interventions completed:   Yes SDOH Interventions Today    Flowsheet Row Most Recent Value  SDOH Interventions   Transportation Interventions Intervention Not Indicated       Care Coordination Interventions:  Detailed above    Encounter Outcome:  Pt. Visit Completed    Chong Sicilian, BSN, RN-BC RN Care Coordinator [place]: [phone] Main #: [phone]

## 2022-08-02 ENCOUNTER — Inpatient Hospital Stay (HOSPITAL_COMMUNITY): Payer: 59

## 2022-08-02 ENCOUNTER — Inpatient Hospital Stay (HOSPITAL_COMMUNITY)
Admission: AD | Admit: 2022-08-02 | Discharge: 2022-08-02 | Disposition: A | Discharge status: Home / Self Care | Payer: 59 | Attending: Obstetrics and Gynecology | Admitting: Obstetrics and Gynecology

## 2022-08-02 ENCOUNTER — Encounter (HOSPITAL_COMMUNITY): Payer: Self-pay | Admitting: *Deleted

## 2022-08-02 DIAGNOSIS — O418X10 Other specified disorders of amniotic fluid and membranes, first trimester, not applicable or unspecified: Secondary | ICD-10-CM | POA: Diagnosis not present

## 2022-08-02 DIAGNOSIS — Z3491 Encounter for supervision of normal pregnancy, unspecified, first trimester: Secondary | ICD-10-CM

## 2022-08-02 DIAGNOSIS — Z3A01 Less than 8 weeks gestation of pregnancy: Secondary | ICD-10-CM | POA: Diagnosis not present

## 2022-08-02 DIAGNOSIS — O208 Other hemorrhage in early pregnancy: Secondary | ICD-10-CM | POA: Diagnosis not present

## 2022-08-02 DIAGNOSIS — O468X1 Other antepartum hemorrhage, first trimester: Secondary | ICD-10-CM | POA: Diagnosis not present

## 2022-08-02 DIAGNOSIS — O26891 Other specified pregnancy related conditions, first trimester: Secondary | ICD-10-CM | POA: Insufficient documentation

## 2022-08-02 LAB — CBC
HCT: 36.9 % (ref 36.0–46.0)
Hemoglobin: 12.9 g/dL (ref 12.0–15.0)
MCH: 30.6 pg (ref 26.0–34.0)
MCHC: 35 g/dL (ref 30.0–36.0)
MCV: 87.6 fL (ref 80.0–100.0)
Platelets: 306 10*3/uL (ref 150–400)
RBC: 4.21 MIL/uL (ref 3.87–5.11)
RDW: 12.5 % (ref 11.5–15.5)
WBC: 8.9 10*3/uL (ref 4.0–10.5)
nRBC: 0 % (ref 0.0–0.2)

## 2022-08-02 LAB — HCG, QUANTITATIVE, PREGNANCY: hCG, Beta Chain, Quant, S: 21770 m[IU]/mL — ABNORMAL HIGH (ref <5)

## 2022-08-02 LAB — ABO/RH: ABO/RH(D): O POS

## 2022-08-02 NOTE — MAU Note (Signed)
.  Elaine Lopez is a 32 y.o. at [redacted]w[redacted]d here in MAU reporting: started having vag bleeding and cramping this morning . Had had brown discharge prior to bleeding today.Thought she might be having a miscarriage. Has Bicornanate  uterus.  Was given Flagyl in ED because she was told eh had PID. Still taking antibiotic.  LMP:  Onset of complaint: today Pain score: 2 Vitals:   08/02/22 1049  BP: 111/67  Pulse: 80  Resp: 18  Temp: 98.4 F (36.9 C)     FHT:n/a Lab orders placed from triage:  u/a

## 2022-08-02 NOTE — MAU Provider Note (Signed)
History     [id number]  Arrival date and time: 08/02/22 1028    Chief Complaint  Patient presents with   Vaginal Bleeding   Abdominal Pain     HPI Elaine Lopez is a 32 y.o. at 67w6dwho presents for vaginal bleeding & abdominal cramping. Was seen in ED a few weeks ago for RLQ abdominal pain - that pain has since resolved. Was having brown discharge until a few days ago. Current symptoms started this morning. Reports bilateral lower abdominal cramping that she rates 2/10. Also noticed red blood on toilet paper with streak on underwear. Not saturating pads. Has first appointment with [facility] on Monday.   OB History     Gravida  1   Para  0   Term  0   Preterm  0   AB  0   Living  0      SAB  0   IAB  0   Ectopic  0   Multiple  0   Live Births  0           Past Medical History:  Diagnosis Date   Abnormal TSH 01/26/2019   Allergy    Chronic depression    Endometriosis    stage 4    History of drug abuse (HCC)    Lumbar herniated disc    x3   Ovarian cyst    PTSD (post-traumatic stress disorder)    Vitamin D insufficiency 01/26/2019    Past Surgical History:  Procedure Laterality Date   laproscopy     x3   LUMBAR EPIDURAL INJECTION     TONSILLECTOMY AND ADENOIDECTOMY      Family History  Problem Relation Age of Onset   Thyroid disease Mother    Hypertension Mother    Depression Mother    Vascular Disease Mother    Depression Father    Cancer Sister        ewings sarcoma   Heart attack Maternal Grandfather    COPD Paternal Grandfather    Heart disease Paternal Grandfather     No Known Allergies  No current facility-administered medications on file prior to encounter.   Current Outpatient Medications on File Prior to Encounter  Medication Sig Dispense Refill   busPIRone (BUSPAR) 15 MG tablet Take 1 tablet (15 mg total) by mouth 2 (two) times daily. 180 tablet 1   desvenlafaxine (PRISTIQ) 50 MG 24 hr tablet Take 1  tablet (50 mg total) by mouth daily. 30 tablet 3   gabapentin (NEURONTIN) 300 MG capsule Take 2 capsules (600 mg total) by mouth 3 (three) times daily as needed. 270 capsule 1   Melatonin 10 MG CAPS Take 2 capsules by mouth at bedtime.      metroNIDAZOLE (FLAGYL) 500 MG tablet Take 1 tablet (500 mg total) by mouth 2 (two) times daily for 14 days. 28 tablet 0   Omeprazole 20 MG TBEC Take 20 mg by mouth daily.     temazepam (RESTORIL) 30 MG capsule Take 1 capsule (30 mg total) by mouth at bedtime as needed for sleep. 30 capsule 0   VITAMIN D, CHOLECALCIFEROL, PO Take 1,000 Units by mouth daily.     cyanocobalamin (VITAMIN B12) 500 MCG tablet Take 500 mcg by mouth daily. (Patient not taking: Reported on 08/02/2022)     ondansetron (ZOFRAN-ODT) 4 MG disintegrating tablet Take 1 tablet (4 mg total) by mouth every 8 (eight) hours as needed for nausea or vomiting. (Patient not taking:  Reported on 08/02/2022) 20 tablet 0   phentermine (ADIPEX-P) 37.5 MG tablet Take 1 tablet (37.5 mg total) by mouth daily before breakfast. (Patient not taking: Reported on 06/19/2022) 30 tablet 0   propranolol (INDERAL) 20 MG tablet Take 1 tablet (20 mg total) by mouth 2 (two) times daily. (Patient not taking: Reported on 08/02/2022) 180 tablet 0   sucralfate (CARAFATE) 1 g tablet Take 1 tablet (1 g total) by mouth 3 (three) times daily as needed. Dissolve 1 tablet into a glass of water and drink up to 3 times daily as needed prior to meals 60 tablet 0     ROS Pertinent positives and negative per HPI, all others reviewed and negative  Physical Exam   BP 111/67   Pulse 80   Temp 98.4 F (36.9 C)   Resp 18   Ht 5' 10"$  (1.778 m)   Wt 98.4 kg   LMP 06/23/2022 (Approximate) Comment: + home pregancy test today  BMI 31.14 kg/m   Patient Vitals for the past 24 hrs:  BP Temp Pulse Resp Height Weight  08/02/22 1049 111/67 98.4 F (36.9 C) 80 18 5' 10"$  (1.778 m) 98.4 kg    Physical Exam Vitals and nursing note reviewed.   Constitutional:      General: She is not in acute distress.    Appearance: She is well-developed.  HENT:     Head: Normocephalic and atraumatic.  Pulmonary:     Effort: Pulmonary effort is normal. No respiratory distress.  Neurological:     General: No focal deficit present.     Mental Status: She is alert.  Psychiatric:        Mood and Affect: Mood normal.        Behavior: Behavior normal.       Labs Results for orders placed or performed during the hospital encounter of 08/02/22 (from the past 24 hour(s))  ABO/Rh     Status: None   Collection Time: 08/02/22 11:30 AM  Result Value Ref Range   ABO/RH(D) O POS    No rh immune globuloin      NOT A RH IMMUNE GLOBULIN CANDIDATE, PT RH POSITIVE Performed at [facility] [address], [place], [place] [address]   CBC     Status: None   Collection Time: 08/02/22 11:31 AM  Result Value Ref Range   WBC 8.9 4.0 - 10.5 K/uL   RBC 4.21 3.87 - 5.11 MIL/uL   Hemoglobin 12.9 12.0 - 15.0 g/dL   HCT 36.9 36.0 - 46.0 %   MCV 87.6 80.0 - 100.0 fL   MCH 30.6 26.0 - 34.0 pg   MCHC 35.0 30.0 - 36.0 g/dL   RDW 12.5 11.5 - 15.5 %   Platelets 306 150 - 400 K/uL   nRBC 0.0 0.0 - 0.2 %  hCG, quantitative, pregnancy     Status: Abnormal   Collection Time: 08/02/22 11:31 AM  Result Value Ref Range   hCG, Beta Chain, Quant, S 21,770 (H) <5 mIU/mL    Imaging US OB Transvaginal  Result Date: 08/02/2022 CLINICAL DATA:  [id number] Vaginal bleeding in pregnancy, first trimester [id number] EXAM: TRANSVAGINAL OB ULTRASOUND TECHNIQUE: Transvaginal ultrasound was performed for complete evaluation of the gestation as well as the maternal uterus, adnexal regions, and pelvic cul-de-sac. COMPARISON:  None Available. FINDINGS: Intrauterine gestational sac: Single Yolk sac:  Visualized. Embryo:  Visualized. Cardiac Activity: Visualized. Heart Rate: 126 bpm CRL:   0.5 cm = 6 weeks 1 day  [place] EDC: 03/27/2023 Subchorionic hemorrhage: Small subchorionic fluid  collection consistent with hematoma measuring 0.8 x 0.4 cm. Adnexa: No masses or fluid collections. Right-sided corpus luteum measures 1.6 cm. This does not need to be evaluated further. IMPRESSION: 1. Single viable intrauterine pregnancy measuring 6 weeks 1 day with ultrasound [facility] 03/27/2023. 2. Small subchorionic fluid collection consistent with hematoma. Electronically Signed   By: Sammie Bench M.D.   On: 08/02/2022 12:15    MAU Course  Procedures Lab Orders         Urinalysis, Routine w reflex microscopic -Urine, Clean Catch         CBC         hCG, quantitative, pregnancy    No orders of the defined types were placed in this encounter.  Imaging Orders         US OB Transvaginal     MDM Reviewed results from previous ED visit Labs & imaging ordered today  RH positive Ultrasound shows live IUP, size consistent with LMP dating. Small subchorionic hemorrhage.   Wet prep & GC/CT negative earlier this month Assessment and Plan   1. Normal IUP (intrauterine pregnancy) on prenatal ultrasound, first trimester   2. Subchorionic hematoma in first trimester, single or unspecified fetus   3. [redacted] weeks gestation of pregnancy    -Reviewed bleeding precautions & reasons to return to MAU -Keep f/u with OB  Jorje Guild, NP 08/02/22 1:43 PM

## 2022-08-06 DIAGNOSIS — N911 Secondary amenorrhea: Secondary | ICD-10-CM | POA: Diagnosis not present

## 2022-08-07 ENCOUNTER — Other Ambulatory Visit: Payer: Self-pay | Admitting: *Deleted

## 2022-08-07 DIAGNOSIS — F32A Depression, unspecified: Secondary | ICD-10-CM

## 2022-08-07 MED ORDER — DESVENLAFAXINE SUCCINATE ER 50 MG PO TB24: 50.0000 mg | ORAL_TABLET | Freq: Every day | ORAL | 0 refills | Status: DC

## 2022-08-08 IMAGING — US US THYROID
1 series · 14 of 25 positions shown · non-contrast
Comparison: None.

CLINICAL DATA: TSH suppression

EXAM:
THYROID ULTRASOUND
TECHNIQUE: Ultrasound examination of the thyroid gland and adjacent soft
tissues was performed.

[Series 1: us thyroid · 0.06mm/px · 14 of 116 slices shown]
[im 1/116]
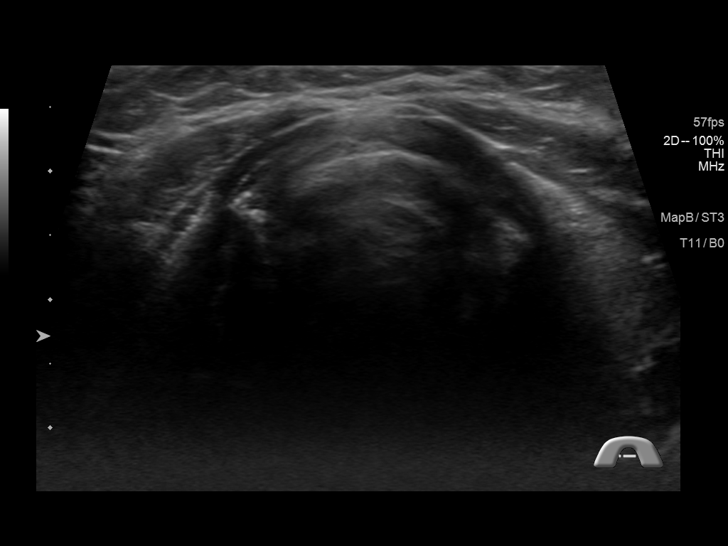
[im 10/116]
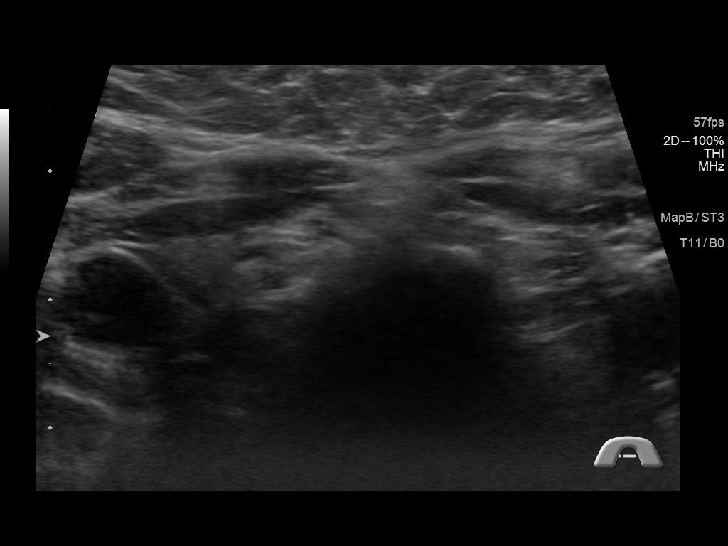
[im 20/116]
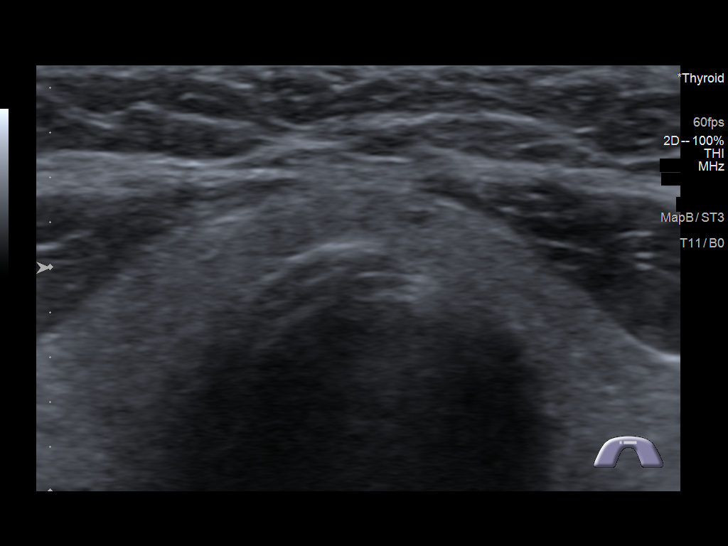
[im 29/116]
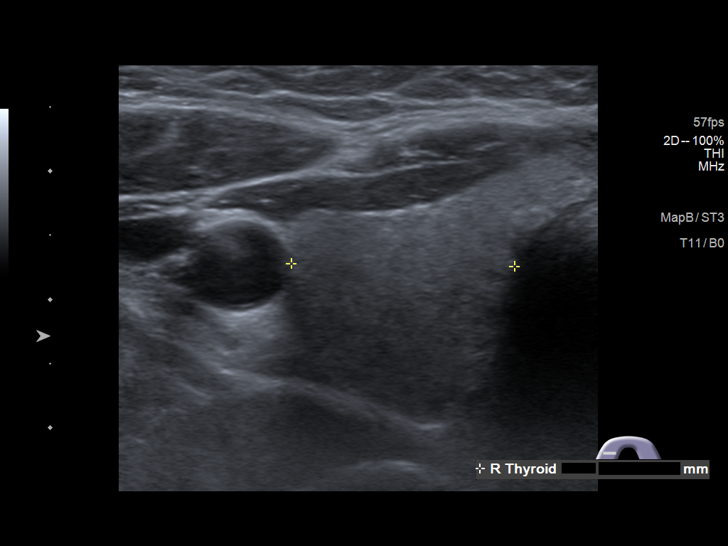
[im 39/116]
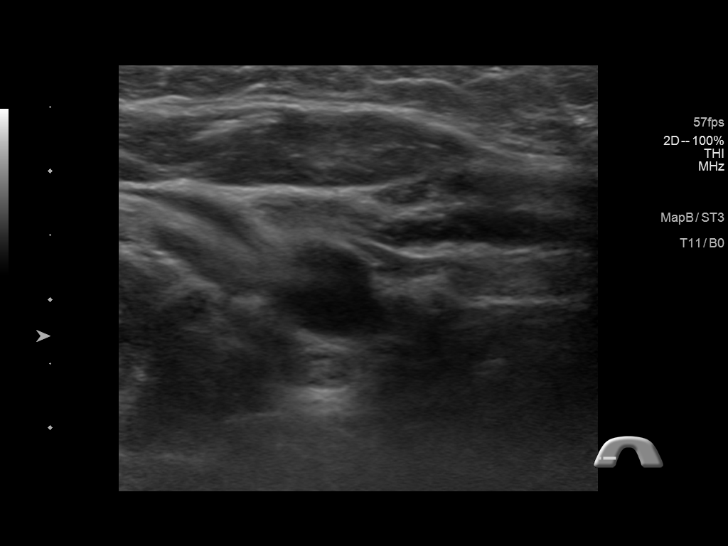
[im 44/116]
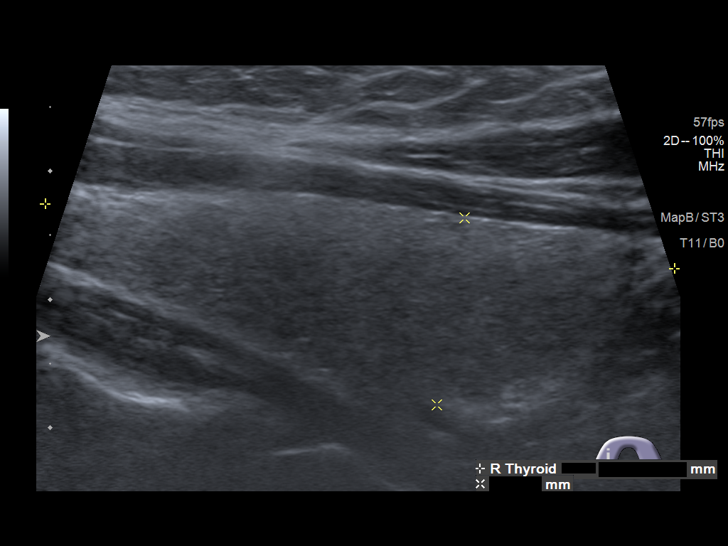
[im 53/116]
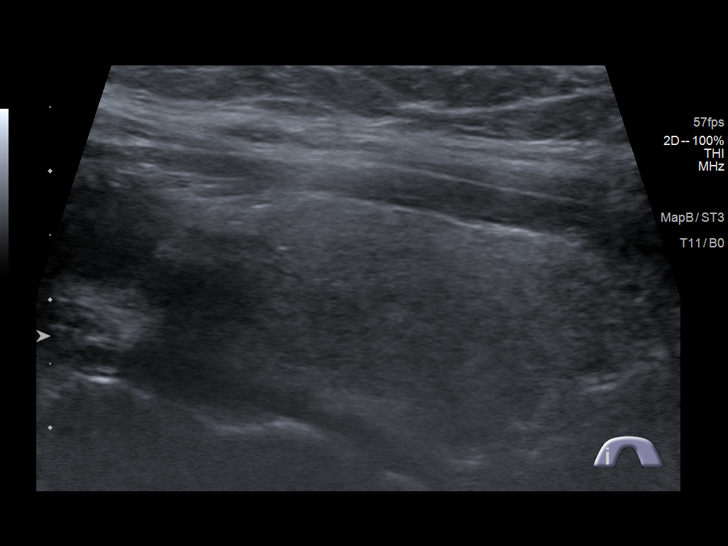
[im 63/116]
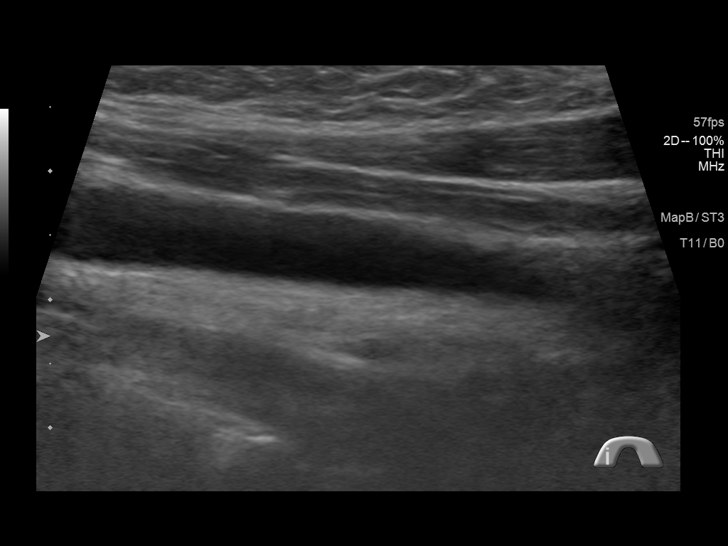
[im 72/116]
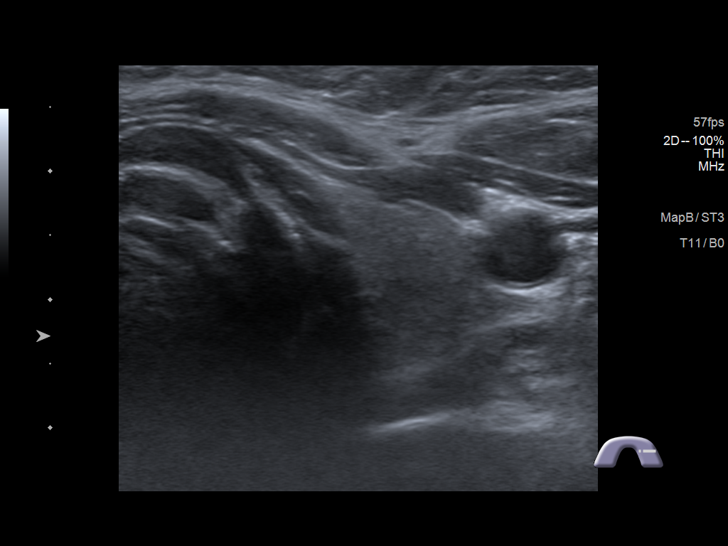
[im 77/116]
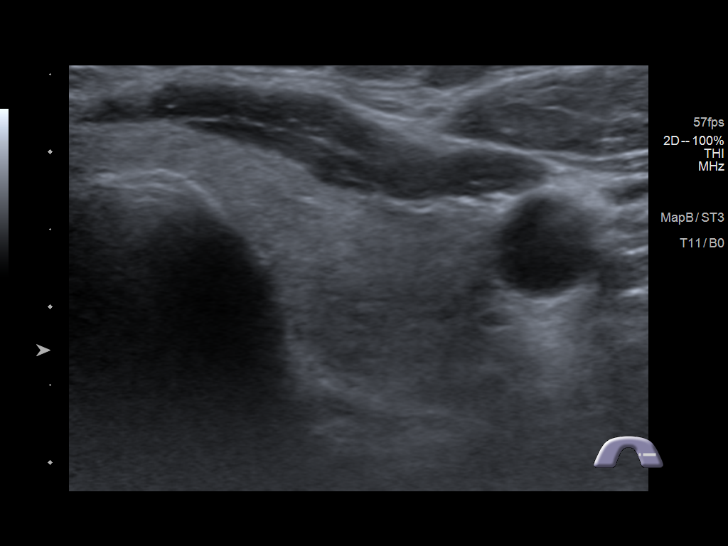
[im 87/116]
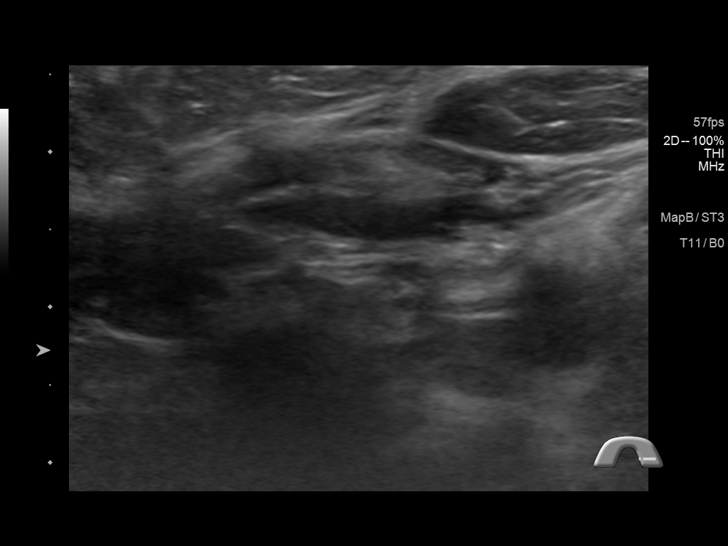
[im 96/116]
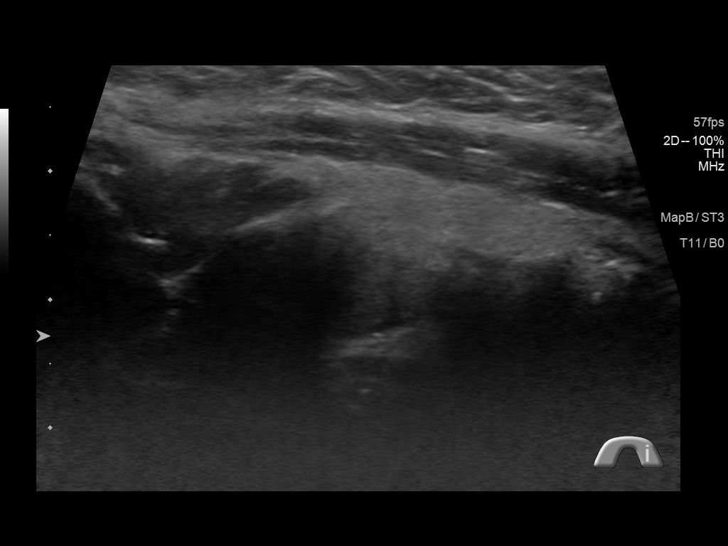
[im 106/116]
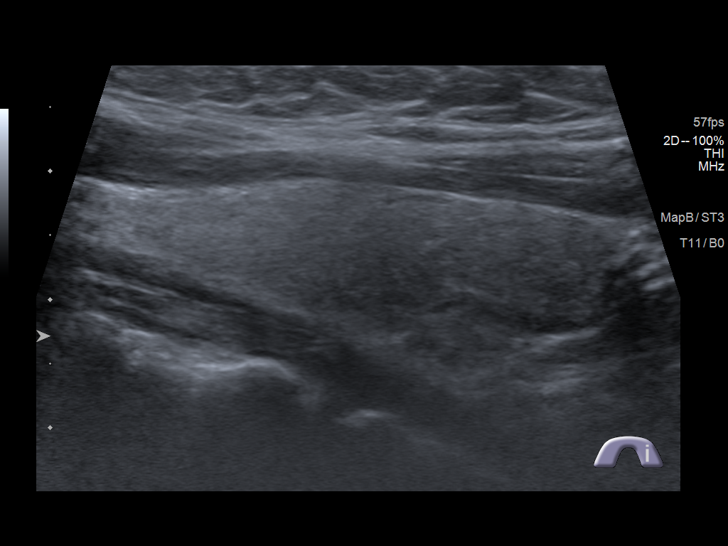
[im 116/116]
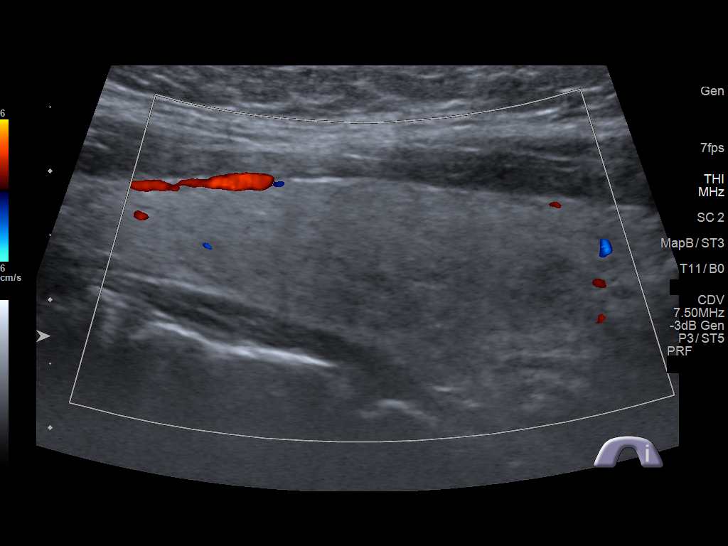

[14 of 25 positions shown; findings below may reference images not displayed]

FINDINGS: Parenchymal Echotexture: Normal

Isthmus: 2 mm

Right lobe: 4.9 x 1.5 x 1.7 cm

Left lobe: 4.8 x 1.5 x 1.5 cm

_________________________________________________________

Estimated total number of nodules >/= 1 cm: 0

Number of spongiform nodules >/=  2 cm not described below (TR1): 0

Number of mixed cystic and solid nodules >/= 1.5 cm not described
below (TR2): 0

_________________________________________________________

No discrete nodules are seen within the thyroid gland.
IMPRESSION: Normal thyroid ultrasound for age

The above is in keeping with the ACR TI-RADS recommendations - [HOSPITAL] 7085;[DATE].

## 2022-08-09 ENCOUNTER — Encounter: Payer: Self-pay | Admitting: Family Medicine

## 2022-08-09 ENCOUNTER — Ambulatory Visit (INDEPENDENT_AMBULATORY_CARE_PROVIDER_SITE_OTHER): Payer: 59 | Admitting: Family Medicine

## 2022-08-09 VITALS — BP 117/80 | HR 83 | Temp 98.5°F | Ht 70.0 in | Wt 219.0 lb

## 2022-08-09 DIAGNOSIS — F339 Major depressive disorder, recurrent, unspecified: Secondary | ICD-10-CM | POA: Diagnosis not present

## 2022-08-09 DIAGNOSIS — Z3401 Encounter for supervision of normal first pregnancy, first trimester: Secondary | ICD-10-CM

## 2022-08-09 DIAGNOSIS — F411 Generalized anxiety disorder: Secondary | ICD-10-CM

## 2022-08-09 DIAGNOSIS — N809 Endometriosis, unspecified: Secondary | ICD-10-CM

## 2022-08-09 DIAGNOSIS — F5104 Psychophysiologic insomnia: Secondary | ICD-10-CM | POA: Diagnosis not present

## 2022-08-09 MED ORDER — GABAPENTIN 300 MG PO CAPS: ORAL_CAPSULE | ORAL | 0 refills | Status: DC

## 2022-08-09 MED ORDER — TEMAZEPAM 7.5 MG PO CAPS: ORAL_CAPSULE | ORAL | 0 refills | Status: DC

## 2022-08-09 MED ORDER — TEMAZEPAM 15 MG PO CAPS: 15.0000 mg | ORAL_CAPSULE | Freq: Every evening | ORAL | 0 refills | Status: DC | PRN

## 2022-08-09 MED ORDER — TEMAZEPAM 22.5 MG PO CAPS: 22.5000 mg | ORAL_CAPSULE | Freq: Every evening | ORAL | 0 refills | Status: DC | PRN

## 2022-08-09 NOTE — Patient Instructions (Addendum)
Start temazepam 22.5 mg nightly for 1 week. Then 15 mg nightly for 1 week, the 7.5 mg for 1 week. Then take 7.5 mg every other day for 1 week, then discontinue.   Start gabapentin 300 mg nightly for 1 week. Then 300 mg every other day for 1 week. Then stop.

## 2022-08-09 NOTE — Progress Notes (Signed)
Established Patient Office Visit  Subjective   [name], female    DOB: [birth year]  Age: 32 y.o. MRN: [id number]  Chief Complaint  Patient presents with   Establish Care    HPI Elaine Lopez is here for a chronic follow up. She recently found out that she is pregnant. She has had an appt with her OB to establish care. Her OB recommended that she wean off of gabapentin that she has been taking for endometriosis.  She has been taking 600 mg at bedtime for years now.   She has also been taking restoril at bedtime for over a year for insomnia. She also needs to wean off of this. Her Ob has sent in hydroxyzine for her to take at bedtime once she is weaned off of restoril.   She has been taking buspar BID and pristiq for anxiety and depression. She reports increased symptoms since finding out that she is pregnant and she is nervous about possible complications during her pregnancy. She is interested in a referral for counseling.       08/09/2022   11:11 AM 06/19/2022    9:45 AM 04/09/2022    4:09 PM  Depression screen PHQ 2/9  Decreased Interest 2 2 3  $ Down, Depressed, Hopeless 2 2 3  $ PHQ - 2 Score 4 4 6  $ Altered sleeping 3 3 3  $ Tired, decreased energy 3 3 3  $ Change in appetite 3 2 2  $ Feeling bad or failure about yourself  1 3 3  $ Trouble concentrating 2 1 3  $ Moving slowly or fidgety/restless 2 1 2  $ Suicidal thoughts 0 1 1  PHQ-9 Score 18 18 23  $ Difficult doing work/chores Somewhat difficult Very difficult Very difficult      08/09/2022   11:12 AM 06/19/2022    9:44 AM 04/09/2022    4:10 PM 04/09/2022    3:59 PM  GAD 7 : Generalized Anxiety Score  Nervous, Anxious, on Edge 3 2 3 2  $ Control/stop worrying 3 2 3 2  $ Worry too much - different things 3 2 2 1  $ Trouble relaxing 2 2 2 1  $ Restless 3 2 2 1  $ Easily annoyed or irritable 3 2 2 1  $ Afraid - awful might happen 2 0 1 0  Total GAD 7 Score 19 12 15 8  $ Anxiety Difficulty Very difficult  Very difficult Somewhat  difficult     Past Medical History:  Diagnosis Date   Abnormal TSH 01/26/2019   Allergy    Chronic depression    Endometriosis    stage 4    History of drug abuse (HCC)    Lumbar herniated disc    x3   Ovarian cyst    PTSD (post-traumatic stress disorder)    Vitamin D insufficiency 01/26/2019      ROS As per HPI.    Objective:     BP 117/80   Pulse 83   Temp 98.5 F (36.9 C)   Ht 5' 10"$  (1.778 m)   Wt 219 lb (99.3 kg)   LMP 06/23/2022 (Approximate) Comment: + home pregancy test today  SpO2 99%   BMI 31.42 kg/m    Physical Exam Vitals and nursing note reviewed.  Constitutional:      General: She is not in acute distress.    Appearance: Normal appearance. She is not ill-appearing, toxic-appearing or diaphoretic.  Cardiovascular:     Rate and Rhythm: Normal rate and regular rhythm.     Heart sounds: Normal  heart sounds. No murmur heard. Pulmonary:     Effort: Pulmonary effort is normal. No respiratory distress.     Breath sounds: Normal breath sounds. No wheezing.  Musculoskeletal:     Right lower leg: No edema.     Left lower leg: No edema.  Skin:    General: Skin is warm and dry.  Neurological:     General: No focal deficit present.     Mental Status: She is alert and oriented to person, place, and time.  Psychiatric:        Mood and Affect: Mood normal.        Behavior: Behavior normal.        Thought Content: Thought content normal.        Judgment: Judgment normal.      No results found for any visits on 08/09/22.    The ASCVD Risk score (Arnett DK, et al., 2019) failed to calculate for the following reasons:   The 2019 ASCVD risk score is only valid for ages 15 to 59    Assessment & Plan:   Elaine Lopez was seen today for medical management of chronic issues.  Diagnoses and all orders for this visit:  Depression, recurrent ([place]) Generalized anxiety disorder Uncontrolled. Denies SI. Ok to continue buspar and pristiq. Psychology referral  placed. Continue follow up with OB. Can follow up with me if Ob is not comfortable adjusting medications if needed.  -     Ambulatory referral to Psychology  Primigravida in first trimester Continuee follow up with OB.   Endometriosis Continue follow up with OB. Weaning instructions given for gabapentin.  -     gabapentin (NEURONTIN) 300 MG capsule; Take one capsule night x 1 week. Then one capsule every other night for 1 week. Then discontinue.  Chronic insomnia Will wean off due to pregnancy. Instructions provided. Ob has sent in hydroxyzine for her to start once she weans off restoril.  -     temazepam (RESTORIL) 22.5 MG capsule; Take 1 capsule (22.5 mg total) by mouth at bedtime as needed for sleep. -     temazepam (RESTORIL) 15 MG capsule; Take 1 capsule (15 mg total) by mouth at bedtime as needed for sleep. -     temazepam (RESTORIL) 7.5 MG capsule; Take 1 capsule nightly x 1 week. Then take 1 capsule every other night for 1 week.  Return in about 1 year (around 08/10/2023). Sooner for new or worsening symptoms.   The patient indicates understanding of these issues and agrees with the plan.    Gwenlyn Perking, FNP

## 2022-08-13 DIAGNOSIS — Z3685 Encounter for antenatal screening for Streptococcus B: Secondary | ICD-10-CM | POA: Diagnosis not present

## 2022-08-13 DIAGNOSIS — Z3481 Encounter for supervision of other normal pregnancy, first trimester: Secondary | ICD-10-CM | POA: Diagnosis not present

## 2022-08-14 ENCOUNTER — Telehealth: Payer: Self-pay

## 2022-08-14 NOTE — Telephone Encounter (Signed)
[name] (KeyVeleta Miners) PA Case ID #: [id number] Rx #: [id number] Need Help? Call us at [phone] Status sent iconSent to Plan today Drug Temazepam 7.'5MG'$  capsules ePA cloud Child psychotherapist Electronic PA Form [phone] NCPDP)

## 2022-08-30 DIAGNOSIS — Z3481 Encounter for supervision of other normal pregnancy, first trimester: Secondary | ICD-10-CM | POA: Diagnosis not present

## 2022-08-30 DIAGNOSIS — Z113 Encounter for screening for infections with a predominantly sexual mode of transmission: Secondary | ICD-10-CM | POA: Diagnosis not present

## 2022-08-30 DIAGNOSIS — Z3A10 10 weeks gestation of pregnancy: Secondary | ICD-10-CM | POA: Diagnosis not present

## 2022-08-30 DIAGNOSIS — Z3401 Encounter for supervision of normal first pregnancy, first trimester: Secondary | ICD-10-CM | POA: Diagnosis not present

## 2022-09-13 ENCOUNTER — Inpatient Hospital Stay (HOSPITAL_COMMUNITY)
Admission: AD | Admit: 2022-09-13 | Discharge: 2022-09-13 | Disposition: A | Discharge status: Home / Self Care | Payer: 59 | Attending: Obstetrics and Gynecology | Admitting: Obstetrics and Gynecology

## 2022-09-13 ENCOUNTER — Other Ambulatory Visit (HOSPITAL_COMMUNITY): Payer: Self-pay

## 2022-09-13 ENCOUNTER — Inpatient Hospital Stay (HOSPITAL_COMMUNITY): Payer: 59

## 2022-09-13 DIAGNOSIS — Z87891 Personal history of nicotine dependence: Secondary | ICD-10-CM | POA: Diagnosis not present

## 2022-09-13 DIAGNOSIS — O209 Hemorrhage in early pregnancy, unspecified: Secondary | ICD-10-CM

## 2022-09-13 DIAGNOSIS — R002 Palpitations: Secondary | ICD-10-CM

## 2022-09-13 DIAGNOSIS — O99891 Other specified diseases and conditions complicating pregnancy: Secondary | ICD-10-CM | POA: Diagnosis not present

## 2022-09-13 DIAGNOSIS — O99611 Diseases of the digestive system complicating pregnancy, first trimester: Secondary | ICD-10-CM | POA: Insufficient documentation

## 2022-09-13 DIAGNOSIS — R0789 Other chest pain: Secondary | ICD-10-CM | POA: Diagnosis not present

## 2022-09-13 DIAGNOSIS — R102 Pelvic and perineal pain: Secondary | ICD-10-CM | POA: Insufficient documentation

## 2022-09-13 DIAGNOSIS — K5900 Constipation, unspecified: Secondary | ICD-10-CM | POA: Insufficient documentation

## 2022-09-13 DIAGNOSIS — Z3A12 12 weeks gestation of pregnancy: Secondary | ICD-10-CM | POA: Diagnosis not present

## 2022-09-13 DIAGNOSIS — Z79899 Other long term (current) drug therapy: Secondary | ICD-10-CM | POA: Diagnosis not present

## 2022-09-13 DIAGNOSIS — O208 Other hemorrhage in early pregnancy: Secondary | ICD-10-CM | POA: Diagnosis not present

## 2022-09-13 LAB — CBC
HCT: 37.5 % (ref 36.0–46.0)
Hemoglobin: 12.7 g/dL (ref 12.0–15.0)
MCH: 29.5 pg (ref 26.0–34.0)
MCHC: 33.9 g/dL (ref 30.0–36.0)
MCV: 87.2 fL (ref 80.0–100.0)
Platelets: 276 10*3/uL (ref 150–400)
RBC: 4.3 MIL/uL (ref 3.87–5.11)
RDW: 12 % (ref 11.5–15.5)
WBC: 12.7 10*3/uL — ABNORMAL HIGH (ref 4.0–10.5)
nRBC: 0 % (ref 0.0–0.2)

## 2022-09-13 LAB — TSH: TSH: 1.659 u[IU]/mL (ref 0.350–4.500)

## 2022-09-13 NOTE — MAU Note (Signed)
Elaine Lopez is a 32 y.o. at [redacted]w[redacted]d here in MAU reporting: yesterday morning had a really painful BM, and immediately started bleeding.  Was bright red, continued through the day, more than spotting, but not like a period.  Passed a couple of small clots last night.  Has been brown today.  Sharp pains LRQ into vagina.  Past few days has had chest pain, off and on. Usually when she is laying down, off to either the rt or left of mid sternum, doesn't last long.   Onset of complaint: yesterday Pain score: abd 7- sharp, cramping 3, chest 6 Vitals:   09/13/22 1722  BP: 118/78  Resp: 18  Temp: 98.8 F (37.1 C)  SpO2: 99%     FHT:156 Lab orders placed from triage:

## 2022-09-13 NOTE — MAU Provider Note (Addendum)
History     CSN: [id number]  Arrival date and time: 09/13/22 1610   None     Chief Complaint  Patient presents with   Vaginal Bleeding   Abdominal Pain   Chest Pain   HPI Elaine Lopez is a 32 y.o. G1P0 at [redacted]w[redacted]d who presents to MAU for bleeding. She reports she had a painful bowel movement last night and then immediately had a lot of bright red bleeding. She initially thought it was rectal but then noticed that the bleeding was coming from her vagina. She reports she has a known subchorionic hemorrhage and thought this could possibly be causing the bleeding, however the bleeding has continued today which concerned her. She reports she was passing a few small clots last night but bleeding is more brown now. She reports some lower abdominal cramping and vaginal pain. She took Tylenol this morning which did not help. She denies itching, odor, or urinary s/s. She reports some constipation and hard stools.   Patient also reports for the past few days she has had "chest pain" on both sides of her sternum however when the pain occurs it is typically on one side of the sternum. She reports she has had intermittent palpitations as well, but no shortness of breath. She is not having symptoms currently. She has a history of palpitations and previously on Propanolol, however is no longer taking. She also reports a history of thyroid issues, but is not on medications. Previously seeing an endocrinologist, however has not seen recently.   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   OB History     Gravida  1   Para  0   Term  0   Preterm  0   AB  0   Living  0      SAB  0   IAB  0   Ectopic  0   Multiple  0   Live Births  0           Past Medical History:  Diagnosis Date   Abnormal TSH 01/26/2019   Allergy     Chronic depression    Endometriosis    stage 4    History of drug abuse (HCC)    Lumbar herniated disc    x3   Ovarian cyst    PTSD (post-traumatic stress disorder)    Vitamin D insufficiency 01/26/2019    Past Surgical History:  Procedure Laterality Date   laproscopy     x3   LUMBAR EPIDURAL INJECTION     TONSILLECTOMY AND ADENOIDECTOMY      Family History  Problem Relation Age of Onset   Thyroid disease Mother    Hypertension Mother    Depression Mother    Vascular Disease Mother    Depression Father    Cancer Sister        ewings sarcoma   Heart attack Maternal Grandfather    COPD Paternal Grandfather    Heart disease Paternal Grandfather     Social History   Tobacco Use   Smoking status: Former    Packs/day: .1    Types: Cigarettes    Quit date: 2020    Years since quitting: 4.2   Smokeless tobacco: Never   Tobacco comments:    maybe one a day   Vaping Use   Vaping  Use: Every day  Substance Use Topics   Alcohol use: Not Currently   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    Allergies: No Known Allergies  Medications Prior to Admission  Medication Sig Dispense Refill Last Dose   busPIRone (BUSPAR) 15 MG tablet Take 1 tablet (15 mg total) by mouth 2 (two) times daily. 180 tablet 1    desvenlafaxine (PRISTIQ) 50 MG 24 hr tablet Take 1 tablet (50 mg total) by mouth daily. 90 tablet 0    gabapentin (NEURONTIN) 300 MG capsule Take one capsule night x 1 week. Then one capsule every other night for 1 week. Then discontinue. 10 capsule 0    hydrOXYzine (ATARAX) 10 MG tablet Take 1 tablet by mouth at bedtime. (Patient not taking: Reported on 08/09/2022)      Melatonin 10 MG CAPS Take 2 capsules by mouth at bedtime.       Omeprazole 20 MG TBEC Take 20 mg by mouth daily.      Prenatal Vit-Fe Fumarate-FA (PRENATAL MULTIVITAMIN) TABS tablet Take 1 tablet by mouth daily at 12 noon.      temazepam (RESTORIL)  15 MG capsule Take 1 capsule (15 mg total) by mouth at bedtime as needed for sleep. 7 capsule 0    temazepam (RESTORIL) 22.5 MG capsule Take 1 capsule (22.5 mg total) by mouth at bedtime as needed for sleep. 7 capsule 0    temazepam (RESTORIL) 7.5 MG capsule Take 1 capsule nightly x 1 week. Then take 1 capsule every other night for 1 week. 10 capsule 0    VITAMIN D, CHOLECALCIFEROL, PO Take 1,000 Units by mouth daily.      Review of Systems  Constitutional: Negative.   Respiratory:  Positive for chest tightness.   Cardiovascular:  Positive for palpitations.  Gastrointestinal:  Positive for abdominal pain (cramping) and constipation.  Genitourinary:  Positive for vaginal bleeding.  All other systems reviewed and are negative.  Physical Exam   Blood pressure 118/78, temperature 98.8 F (37.1 C), temperature source Oral, resp. rate 18, height 5\' 10"  (1.778 m), weight 100.1 kg, last menstrual period 06/23/2022, SpO2 99 %.  Physical Exam Vitals and nursing note reviewed.  Constitutional:      General: She is not in acute distress. Cardiovascular:     Rate and Rhythm: Normal rate and regular rhythm.     Heart sounds: Normal heart sounds.  Pulmonary:     Effort: Pulmonary effort is normal. No respiratory distress.     Breath sounds: Normal breath sounds.  Skin:    General: Skin is warm and dry.  Neurological:     General: No focal deficit present.     Mental Status: She is alert and oriented to person, place, and time.  Psychiatric:        Mood and Affect: Mood normal.        Behavior: Behavior normal.    FHR: 156 bpm via doppler   No results found for this or any previous visit (from the past 24 hour(s)).   US OB Comp Less 14 Wks  Result Date: 09/13/2022 CLINICAL DATA:  Vaginal bleeding. Estimated gestational age by LMP is 11 weeks 5 days. Quantitative beta HCG was not ordered. EXAM: OBSTETRIC <14 WK ULTRASOUND TECHNIQUE: Transabdominal ultrasound was performed for evaluation  of the gestation as well as the maternal uterus and adnexal regions. COMPARISON:  08/02/2022 FINDINGS: Intrauterine gestational sac: A single  intrauterine gestational sac is present. Yolk sac:  Yolk sac is not identified. Embryo:  Fetal pole is present. Cardiac Activity: Fetal cardiac activity is observed Heart Rate: 157 bpm CRL:   65 mm   12 w 6 d                  [place] EDC: 03/22/2023 Subchorionic hemorrhage: No definite subchorionic hemorrhage. Several small low-attenuation areas along the subchorionic region likely representing venous lakes. Maternal uterus/adnexae: Uterus is anteverted. No myometrial mass lesions. The right ovary is visualized and appears normal. Left ovary is not identified but no abnormal adnexal masses or collections are seen. No free fluid. IMPRESSION: Single intrauterine pregnancy. Estimated gestational age by crown-rump length is 12 weeks 6 days, representing appropriate interval growth since prior study. No acute complication is identified sonographically. Electronically Signed   By: Lucienne Capers M.D.   On: 09/13/2022 19:21    MAU Course  Procedures  MDM EKG CBC, TSH [place]  VSS, O2 sats >99% on room air. No respiratory distress. EKG with normal sinus rhythm. Heart sounds normal. No acute cardiopulmonary distress. Given history of palpitations and thyroid issues, CBC and TSH drawn. Will place outpatient cardio-ob referral.   US shows no subchorionic hemorrhage. Labs pending. Care handed over to Hansel Feinstein, CNM at 2000.   Renee Harder, CNM 09/13/22, 8:06 PM  Results for orders placed or performed during the hospital encounter of 09/13/22 (from the past 24 hour(s))  CBC     Status: Abnormal   Collection Time: 09/13/22  7:18 PM  Result Value Ref Range   WBC 12.7 (H) 4.0 - 10.5 K/uL   RBC 4.30 3.87 - 5.11 MIL/uL   Hemoglobin 12.7 12.0 - 15.0 g/dL   HCT 37.5 36.0 - 46.0 %   MCV 87.2 80.0 - 100.0 fL   MCH 29.5 26.0 - 34.0 pg   MCHC 33.9 30.0 - 36.0 g/dL    RDW 12.0 11.5 - 15.5 %   Platelets 276 150 - 400 K/uL   nRBC 0.0 0.0 - 0.2 %   EKG showed sinus rhythm US OB Comp Less 14 Wks  Result Date: 09/13/2022 CLINICAL DATA:  Vaginal bleeding. Estimated gestational age by LMP is 11 weeks 5 days. Quantitative beta HCG was not ordered. EXAM: OBSTETRIC <14 WK ULTRASOUND TECHNIQUE: Transabdominal ultrasound was performed for evaluation of the gestation as well as the maternal uterus and adnexal regions. COMPARISON:  08/02/2022 FINDINGS: Intrauterine gestational sac: A single intrauterine gestational sac is present. Yolk sac:  Yolk sac is not identified. Embryo:  Fetal pole is present. Cardiac Activity: Fetal cardiac activity is observed Heart Rate: 157 bpm CRL:   65 mm   12 w 6 d                  [place] EDC: 03/22/2023 Subchorionic hemorrhage: No definite subchorionic hemorrhage. Several small low-attenuation areas along the subchorionic region likely representing venous lakes. Maternal uterus/adnexae: Uterus is anteverted. No myometrial mass lesions. The right ovary is visualized and appears normal. Left ovary is not identified but no abnormal adnexal masses or collections are seen. No free fluid. IMPRESSION: Single intrauterine pregnancy. Estimated gestational age by crown-rump length is 12 weeks 6 days, representing appropriate interval growth since prior study. No acute complication is identified sonographically. Electronically Signed   By: Lucienne Capers M.D.   On: 09/13/2022 19:21    Discussed findings TSH result wont be back tonight Reviewed cannot predict how long bleeding may last or  why it occurred CBC normal   Assessment and Plan  [place] IUP at [redacted]w[redacted]d Vaginal bleeding after known subchorionic hemorrhage Chest wall pain with normal EKG History of some kind of thyroid disorder, TSH pending  Discharge home Bleeding precautions Followup with OB provider Encouraged to return if she develops worsening of symptoms, increase in pain, fever, or other  concerning symptoms.   Seabron Spates, CNM

## 2022-09-18 DIAGNOSIS — O0992 Supervision of high risk pregnancy, unspecified, second trimester: Secondary | ICD-10-CM | POA: Diagnosis not present

## 2022-10-02 DIAGNOSIS — F418 Other specified anxiety disorders: Secondary | ICD-10-CM | POA: Diagnosis not present

## 2022-10-02 DIAGNOSIS — Z6832 Body mass index (BMI) 32.0-32.9, adult: Secondary | ICD-10-CM | POA: Diagnosis not present

## 2022-10-02 DIAGNOSIS — N809 Endometriosis, unspecified: Secondary | ICD-10-CM | POA: Diagnosis not present

## 2022-10-02 DIAGNOSIS — O3402 Maternal care for unspecified congenital malformation of uterus, second trimester: Secondary | ICD-10-CM | POA: Diagnosis not present

## 2022-10-02 DIAGNOSIS — Z8742 Personal history of other diseases of the female genital tract: Secondary | ICD-10-CM | POA: Diagnosis not present

## 2022-10-02 DIAGNOSIS — O99212 Obesity complicating pregnancy, second trimester: Secondary | ICD-10-CM | POA: Diagnosis not present

## 2022-10-02 DIAGNOSIS — E669 Obesity, unspecified: Secondary | ICD-10-CM | POA: Diagnosis not present

## 2022-10-02 DIAGNOSIS — O99342 Other mental disorders complicating pregnancy, second trimester: Secondary | ICD-10-CM | POA: Diagnosis not present

## 2022-10-02 DIAGNOSIS — Z148 Genetic carrier of other disease: Secondary | ICD-10-CM | POA: Diagnosis not present

## 2022-10-02 DIAGNOSIS — Q513 Bicornate uterus: Secondary | ICD-10-CM | POA: Diagnosis not present

## 2022-10-02 DIAGNOSIS — Z720 Tobacco use: Secondary | ICD-10-CM | POA: Diagnosis not present

## 2022-10-02 DIAGNOSIS — Z3A14 14 weeks gestation of pregnancy: Secondary | ICD-10-CM | POA: Diagnosis not present

## 2022-10-03 ENCOUNTER — Other Ambulatory Visit: Payer: Self-pay | Admitting: *Deleted

## 2022-10-03 DIAGNOSIS — F411 Generalized anxiety disorder: Secondary | ICD-10-CM

## 2022-10-03 MED ORDER — BUSPIRONE HCL 15 MG PO TABS: 15.0000 mg | ORAL_TABLET | Freq: Two times a day (BID) | ORAL | 0 refills | Status: DC

## 2022-10-11 ENCOUNTER — Encounter: Payer: Self-pay | Admitting: *Deleted

## 2022-10-29 DIAGNOSIS — F1590 Other stimulant use, unspecified, uncomplicated: Secondary | ICD-10-CM | POA: Diagnosis not present

## 2022-10-29 DIAGNOSIS — E038 Other specified hypothyroidism: Secondary | ICD-10-CM | POA: Diagnosis not present

## 2022-10-29 DIAGNOSIS — Z3689 Encounter for other specified antenatal screening: Secondary | ICD-10-CM | POA: Diagnosis not present

## 2022-10-29 DIAGNOSIS — Z3A18 18 weeks gestation of pregnancy: Secondary | ICD-10-CM | POA: Diagnosis not present

## 2022-10-29 DIAGNOSIS — O99212 Obesity complicating pregnancy, second trimester: Secondary | ICD-10-CM | POA: Diagnosis not present

## 2022-10-29 DIAGNOSIS — O99282 Endocrine, nutritional and metabolic diseases complicating pregnancy, second trimester: Secondary | ICD-10-CM | POA: Diagnosis not present

## 2022-10-29 DIAGNOSIS — O99332 Smoking (tobacco) complicating pregnancy, second trimester: Secondary | ICD-10-CM | POA: Diagnosis not present

## 2022-10-29 DIAGNOSIS — Z79899 Other long term (current) drug therapy: Secondary | ICD-10-CM | POA: Diagnosis not present

## 2022-10-29 DIAGNOSIS — O26892 Other specified pregnancy related conditions, second trimester: Secondary | ICD-10-CM | POA: Diagnosis not present

## 2022-10-29 DIAGNOSIS — E669 Obesity, unspecified: Secondary | ICD-10-CM | POA: Diagnosis not present

## 2022-10-29 DIAGNOSIS — F419 Anxiety disorder, unspecified: Secondary | ICD-10-CM | POA: Diagnosis not present

## 2022-10-29 DIAGNOSIS — O0992 Supervision of high risk pregnancy, unspecified, second trimester: Secondary | ICD-10-CM | POA: Diagnosis not present

## 2022-10-29 DIAGNOSIS — X58XXXA Exposure to other specified factors, initial encounter: Secondary | ICD-10-CM | POA: Diagnosis not present

## 2022-10-29 DIAGNOSIS — F32A Depression, unspecified: Secondary | ICD-10-CM | POA: Diagnosis not present

## 2022-10-29 DIAGNOSIS — N809 Endometriosis, unspecified: Secondary | ICD-10-CM | POA: Diagnosis not present

## 2022-10-29 DIAGNOSIS — O99342 Other mental disorders complicating pregnancy, second trimester: Secondary | ICD-10-CM | POA: Diagnosis not present

## 2022-10-29 DIAGNOSIS — R102 Pelvic and perineal pain: Secondary | ICD-10-CM | POA: Diagnosis not present

## 2022-10-29 DIAGNOSIS — F4310 Post-traumatic stress disorder, unspecified: Secondary | ICD-10-CM | POA: Diagnosis not present

## 2022-10-29 DIAGNOSIS — O34592 Maternal care for other abnormalities of gravid uterus, second trimester: Secondary | ICD-10-CM | POA: Diagnosis not present

## 2022-10-29 DIAGNOSIS — Z6832 Body mass index (BMI) 32.0-32.9, adult: Secondary | ICD-10-CM | POA: Diagnosis not present

## 2022-10-29 DIAGNOSIS — S066XAA Traumatic subarachnoid hemorrhage with loss of consciousness status unknown, initial encounter: Secondary | ICD-10-CM | POA: Diagnosis not present

## 2022-10-29 DIAGNOSIS — Z7982 Long term (current) use of aspirin: Secondary | ICD-10-CM | POA: Diagnosis not present

## 2022-11-05 ENCOUNTER — Other Ambulatory Visit: Payer: Self-pay | Admitting: Family Medicine

## 2022-11-05 DIAGNOSIS — F32A Depression, unspecified: Secondary | ICD-10-CM

## 2022-11-05 NOTE — Telephone Encounter (Signed)
Defer to PCP return tomorrow.  Pt is pregnant.  Not sure if this was the case when this was initially rx'd

## 2022-11-16 DIAGNOSIS — F411 Generalized anxiety disorder: Secondary | ICD-10-CM | POA: Diagnosis not present

## 2022-11-16 DIAGNOSIS — F17200 Nicotine dependence, unspecified, uncomplicated: Secondary | ICD-10-CM | POA: Diagnosis not present

## 2022-11-26 DIAGNOSIS — F17200 Nicotine dependence, unspecified, uncomplicated: Secondary | ICD-10-CM | POA: Diagnosis not present

## 2022-11-26 DIAGNOSIS — F411 Generalized anxiety disorder: Secondary | ICD-10-CM | POA: Diagnosis not present

## 2022-12-03 DIAGNOSIS — L299 Pruritus, unspecified: Secondary | ICD-10-CM | POA: Diagnosis not present

## 2022-12-03 DIAGNOSIS — G2581 Restless legs syndrome: Secondary | ICD-10-CM | POA: Diagnosis not present

## 2023-01-07 DIAGNOSIS — R102 Pelvic and perineal pain: Secondary | ICD-10-CM | POA: Diagnosis not present

## 2023-01-07 DIAGNOSIS — O99353 Diseases of the nervous system complicating pregnancy, third trimester: Secondary | ICD-10-CM | POA: Diagnosis not present

## 2023-01-07 DIAGNOSIS — O99343 Other mental disorders complicating pregnancy, third trimester: Secondary | ICD-10-CM | POA: Diagnosis not present

## 2023-01-07 DIAGNOSIS — Q513 Bicornate uterus: Secondary | ICD-10-CM | POA: Diagnosis not present

## 2023-01-07 DIAGNOSIS — Z3A28 28 weeks gestation of pregnancy: Secondary | ICD-10-CM | POA: Diagnosis not present

## 2023-01-07 DIAGNOSIS — Z23 Encounter for immunization: Secondary | ICD-10-CM | POA: Diagnosis not present

## 2023-01-07 DIAGNOSIS — Z7901 Long term (current) use of anticoagulants: Secondary | ICD-10-CM | POA: Diagnosis not present

## 2023-01-07 DIAGNOSIS — O09893 Supervision of other high risk pregnancies, third trimester: Secondary | ICD-10-CM | POA: Diagnosis not present

## 2023-01-07 DIAGNOSIS — O99213 Obesity complicating pregnancy, third trimester: Secondary | ICD-10-CM | POA: Diagnosis not present

## 2023-01-07 DIAGNOSIS — G2581 Restless legs syndrome: Secondary | ICD-10-CM | POA: Diagnosis not present

## 2023-01-07 DIAGNOSIS — L299 Pruritus, unspecified: Secondary | ICD-10-CM | POA: Diagnosis not present

## 2023-01-07 DIAGNOSIS — O0993 Supervision of high risk pregnancy, unspecified, third trimester: Secondary | ICD-10-CM | POA: Diagnosis not present

## 2023-01-09 DIAGNOSIS — O3402 Maternal care for unspecified congenital malformation of uterus, second trimester: Secondary | ICD-10-CM | POA: Diagnosis not present

## 2023-01-09 DIAGNOSIS — Q513 Bicornate uterus: Secondary | ICD-10-CM | POA: Diagnosis not present

## 2023-01-09 DIAGNOSIS — Z3A29 29 weeks gestation of pregnancy: Secondary | ICD-10-CM | POA: Diagnosis not present

## 2023-01-09 DIAGNOSIS — F419 Anxiety disorder, unspecified: Secondary | ICD-10-CM | POA: Diagnosis not present

## 2023-01-09 DIAGNOSIS — F32A Depression, unspecified: Secondary | ICD-10-CM | POA: Diagnosis not present

## 2023-01-09 DIAGNOSIS — N809 Endometriosis, unspecified: Secondary | ICD-10-CM | POA: Diagnosis not present

## 2023-01-09 DIAGNOSIS — O99210 Obesity complicating pregnancy, unspecified trimester: Secondary | ICD-10-CM | POA: Diagnosis not present

## 2023-01-09 DIAGNOSIS — O99340 Other mental disorders complicating pregnancy, unspecified trimester: Secondary | ICD-10-CM | POA: Diagnosis not present

## 2023-01-09 DIAGNOSIS — M5126 Other intervertebral disc displacement, lumbar region: Secondary | ICD-10-CM | POA: Diagnosis not present

## 2023-01-21 DIAGNOSIS — Z3482 Encounter for supervision of other normal pregnancy, second trimester: Secondary | ICD-10-CM | POA: Diagnosis not present

## 2023-01-21 DIAGNOSIS — Z3483 Encounter for supervision of other normal pregnancy, third trimester: Secondary | ICD-10-CM | POA: Diagnosis not present

## 2023-01-24 DIAGNOSIS — O9A213 Injury, poisoning and certain other consequences of external causes complicating pregnancy, third trimester: Secondary | ICD-10-CM | POA: Diagnosis not present

## 2023-01-24 DIAGNOSIS — O99333 Smoking (tobacco) complicating pregnancy, third trimester: Secondary | ICD-10-CM | POA: Diagnosis not present

## 2023-01-24 DIAGNOSIS — O26893 Other specified pregnancy related conditions, third trimester: Secondary | ICD-10-CM | POA: Diagnosis not present

## 2023-01-24 DIAGNOSIS — F32A Depression, unspecified: Secondary | ICD-10-CM | POA: Diagnosis not present

## 2023-01-24 DIAGNOSIS — F419 Anxiety disorder, unspecified: Secondary | ICD-10-CM | POA: Diagnosis not present

## 2023-01-24 DIAGNOSIS — Y92009 Unspecified place in unspecified non-institutional (private) residence as the place of occurrence of the external cause: Secondary | ICD-10-CM | POA: Diagnosis not present

## 2023-01-24 DIAGNOSIS — O99343 Other mental disorders complicating pregnancy, third trimester: Secondary | ICD-10-CM | POA: Diagnosis not present

## 2023-01-24 DIAGNOSIS — Z3A31 31 weeks gestation of pregnancy: Secondary | ICD-10-CM | POA: Diagnosis not present

## 2023-01-24 DIAGNOSIS — E669 Obesity, unspecified: Secondary | ICD-10-CM | POA: Diagnosis not present

## 2023-01-24 DIAGNOSIS — O99213 Obesity complicating pregnancy, third trimester: Secondary | ICD-10-CM | POA: Diagnosis not present

## 2023-01-24 DIAGNOSIS — F17200 Nicotine dependence, unspecified, uncomplicated: Secondary | ICD-10-CM | POA: Diagnosis not present

## 2023-01-24 DIAGNOSIS — W010XXA Fall on same level from slipping, tripping and stumbling without subsequent striking against object, initial encounter: Secondary | ICD-10-CM | POA: Diagnosis not present

## 2023-02-04 DIAGNOSIS — O99210 Obesity complicating pregnancy, unspecified trimester: Secondary | ICD-10-CM | POA: Diagnosis not present

## 2023-02-04 DIAGNOSIS — Q513 Bicornate uterus: Secondary | ICD-10-CM | POA: Diagnosis not present

## 2023-02-04 DIAGNOSIS — Z3A32 32 weeks gestation of pregnancy: Secondary | ICD-10-CM | POA: Diagnosis not present

## 2023-02-04 DIAGNOSIS — O3402 Maternal care for unspecified congenital malformation of uterus, second trimester: Secondary | ICD-10-CM | POA: Diagnosis not present

## 2023-02-04 DIAGNOSIS — Z720 Tobacco use: Secondary | ICD-10-CM | POA: Diagnosis not present

## 2023-02-04 DIAGNOSIS — O26899 Other specified pregnancy related conditions, unspecified trimester: Secondary | ICD-10-CM | POA: Diagnosis not present

## 2023-02-04 DIAGNOSIS — R102 Pelvic and perineal pain: Secondary | ICD-10-CM | POA: Diagnosis not present

## 2023-02-04 DIAGNOSIS — O0993 Supervision of high risk pregnancy, unspecified, third trimester: Secondary | ICD-10-CM | POA: Diagnosis not present

## 2023-02-04 DIAGNOSIS — O43199 Other malformation of placenta, unspecified trimester: Secondary | ICD-10-CM | POA: Diagnosis not present

## 2023-02-19 DIAGNOSIS — O26893 Other specified pregnancy related conditions, third trimester: Secondary | ICD-10-CM | POA: Diagnosis not present

## 2023-02-19 DIAGNOSIS — F17290 Nicotine dependence, other tobacco product, uncomplicated: Secondary | ICD-10-CM | POA: Diagnosis not present

## 2023-02-19 DIAGNOSIS — O99213 Obesity complicating pregnancy, third trimester: Secondary | ICD-10-CM | POA: Diagnosis not present

## 2023-02-19 DIAGNOSIS — O0993 Supervision of high risk pregnancy, unspecified, third trimester: Secondary | ICD-10-CM | POA: Diagnosis not present

## 2023-02-19 DIAGNOSIS — F4310 Post-traumatic stress disorder, unspecified: Secondary | ICD-10-CM | POA: Diagnosis not present

## 2023-02-19 DIAGNOSIS — O99333 Smoking (tobacco) complicating pregnancy, third trimester: Secondary | ICD-10-CM | POA: Diagnosis not present

## 2023-02-19 DIAGNOSIS — F419 Anxiety disorder, unspecified: Secondary | ICD-10-CM | POA: Diagnosis not present

## 2023-02-19 DIAGNOSIS — E038 Other specified hypothyroidism: Secondary | ICD-10-CM | POA: Diagnosis not present

## 2023-02-19 DIAGNOSIS — Z3A34 34 weeks gestation of pregnancy: Secondary | ICD-10-CM | POA: Diagnosis not present

## 2023-02-19 DIAGNOSIS — R102 Pelvic and perineal pain: Secondary | ICD-10-CM | POA: Diagnosis not present

## 2023-02-19 DIAGNOSIS — L299 Pruritus, unspecified: Secondary | ICD-10-CM | POA: Diagnosis not present

## 2023-02-19 DIAGNOSIS — Z79899 Other long term (current) drug therapy: Secondary | ICD-10-CM | POA: Diagnosis not present

## 2023-02-19 DIAGNOSIS — O99343 Other mental disorders complicating pregnancy, third trimester: Secondary | ICD-10-CM | POA: Diagnosis not present

## 2023-02-19 DIAGNOSIS — O23593 Infection of other part of genital tract in pregnancy, third trimester: Secondary | ICD-10-CM | POA: Diagnosis not present

## 2023-02-19 DIAGNOSIS — Z8742 Personal history of other diseases of the female genital tract: Secondary | ICD-10-CM | POA: Diagnosis not present

## 2023-02-19 DIAGNOSIS — O99283 Endocrine, nutritional and metabolic diseases complicating pregnancy, third trimester: Secondary | ICD-10-CM | POA: Diagnosis not present

## 2023-02-19 DIAGNOSIS — M5126 Other intervertebral disc displacement, lumbar region: Secondary | ICD-10-CM | POA: Diagnosis not present

## 2023-02-19 DIAGNOSIS — F32A Depression, unspecified: Secondary | ICD-10-CM | POA: Diagnosis not present

## 2023-03-04 DIAGNOSIS — O43199 Other malformation of placenta, unspecified trimester: Secondary | ICD-10-CM | POA: Diagnosis not present

## 2023-03-04 DIAGNOSIS — Q513 Bicornate uterus: Secondary | ICD-10-CM | POA: Diagnosis not present

## 2023-03-04 DIAGNOSIS — O3483 Maternal care for other abnormalities of pelvic organs, third trimester: Secondary | ICD-10-CM | POA: Diagnosis not present

## 2023-03-04 DIAGNOSIS — O09893 Supervision of other high risk pregnancies, third trimester: Secondary | ICD-10-CM | POA: Diagnosis not present

## 2023-03-04 DIAGNOSIS — Z2911 Encounter for prophylactic immunotherapy for respiratory syncytial virus (RSV): Secondary | ICD-10-CM | POA: Diagnosis not present

## 2023-03-04 DIAGNOSIS — N809 Endometriosis, unspecified: Secondary | ICD-10-CM | POA: Diagnosis not present

## 2023-03-04 DIAGNOSIS — O99340 Other mental disorders complicating pregnancy, unspecified trimester: Secondary | ICD-10-CM | POA: Diagnosis not present

## 2023-03-04 DIAGNOSIS — Z23 Encounter for immunization: Secondary | ICD-10-CM | POA: Diagnosis not present

## 2023-03-04 DIAGNOSIS — O3402 Maternal care for unspecified congenital malformation of uterus, second trimester: Secondary | ICD-10-CM | POA: Diagnosis not present

## 2023-03-04 DIAGNOSIS — O99210 Obesity complicating pregnancy, unspecified trimester: Secondary | ICD-10-CM | POA: Diagnosis not present

## 2023-03-04 DIAGNOSIS — O0993 Supervision of high risk pregnancy, unspecified, third trimester: Secondary | ICD-10-CM | POA: Diagnosis not present

## 2023-03-04 DIAGNOSIS — F419 Anxiety disorder, unspecified: Secondary | ICD-10-CM | POA: Diagnosis not present

## 2023-03-04 DIAGNOSIS — Z720 Tobacco use: Secondary | ICD-10-CM | POA: Diagnosis not present

## 2023-03-04 DIAGNOSIS — M5126 Other intervertebral disc displacement, lumbar region: Secondary | ICD-10-CM | POA: Diagnosis not present

## 2023-03-04 DIAGNOSIS — Z3A36 36 weeks gestation of pregnancy: Secondary | ICD-10-CM | POA: Diagnosis not present

## 2023-03-05 DIAGNOSIS — O3402 Maternal care for unspecified congenital malformation of uterus, second trimester: Secondary | ICD-10-CM | POA: Diagnosis not present

## 2023-03-05 DIAGNOSIS — Z23 Encounter for immunization: Secondary | ICD-10-CM | POA: Diagnosis not present

## 2023-03-05 DIAGNOSIS — R102 Pelvic and perineal pain: Secondary | ICD-10-CM | POA: Diagnosis not present

## 2023-03-05 DIAGNOSIS — N809 Endometriosis, unspecified: Secondary | ICD-10-CM | POA: Diagnosis not present

## 2023-03-05 DIAGNOSIS — Z3A37 37 weeks gestation of pregnancy: Secondary | ICD-10-CM | POA: Diagnosis not present

## 2023-03-05 DIAGNOSIS — O26899 Other specified pregnancy related conditions, unspecified trimester: Secondary | ICD-10-CM | POA: Diagnosis not present

## 2023-03-05 DIAGNOSIS — Q513 Bicornate uterus: Secondary | ICD-10-CM | POA: Diagnosis not present

## 2023-03-05 DIAGNOSIS — O3483 Maternal care for other abnormalities of pelvic organs, third trimester: Secondary | ICD-10-CM | POA: Diagnosis not present

## 2023-03-05 DIAGNOSIS — Z2911 Encounter for prophylactic immunotherapy for respiratory syncytial virus (RSV): Secondary | ICD-10-CM | POA: Diagnosis not present

## 2023-03-05 DIAGNOSIS — M5126 Other intervertebral disc displacement, lumbar region: Secondary | ICD-10-CM | POA: Diagnosis not present

## 2023-03-05 DIAGNOSIS — M5416 Radiculopathy, lumbar region: Secondary | ICD-10-CM | POA: Diagnosis not present

## 2023-03-05 DIAGNOSIS — M5137 Other intervertebral disc degeneration, lumbosacral region: Secondary | ICD-10-CM | POA: Diagnosis not present

## 2023-03-05 DIAGNOSIS — O09893 Supervision of other high risk pregnancies, third trimester: Secondary | ICD-10-CM | POA: Diagnosis not present

## 2023-03-05 DIAGNOSIS — M5136 Other intervertebral disc degeneration, lumbar region: Secondary | ICD-10-CM | POA: Diagnosis not present

## 2023-03-05 DIAGNOSIS — Z3A36 36 weeks gestation of pregnancy: Secondary | ICD-10-CM | POA: Diagnosis not present

## 2023-03-05 DIAGNOSIS — O0993 Supervision of high risk pregnancy, unspecified, third trimester: Secondary | ICD-10-CM | POA: Diagnosis not present

## 2023-03-06 ENCOUNTER — Ambulatory Visit
Admission: EM | Admit: 2023-03-06 | Discharge: 2023-03-06 | Disposition: A | Discharge status: Home / Self Care | Payer: 59 | Attending: Nurse Practitioner | Admitting: Nurse Practitioner

## 2023-03-06 ENCOUNTER — Other Ambulatory Visit: Payer: Self-pay

## 2023-03-06 DIAGNOSIS — Z1152 Encounter for screening for COVID-19: Secondary | ICD-10-CM | POA: Diagnosis not present

## 2023-03-06 DIAGNOSIS — J069 Acute upper respiratory infection, unspecified: Secondary | ICD-10-CM | POA: Diagnosis not present

## 2023-03-06 LAB — POCT INFLUENZA A/B
Influenza A, POC: NEGATIVE
Influenza B, POC: NEGATIVE

## 2023-03-06 LAB — POCT RAPID STREP A (OFFICE): Rapid Strep A Screen: NEGATIVE

## 2023-03-06 MED ORDER — FLUTICASONE PROPIONATE 50 MCG/ACT NA SUSP: 2.0000 | Freq: Every day | NASAL | 0 refills | Status: DC

## 2023-03-06 NOTE — Discharge Instructions (Addendum)
The rapid strep test and influenza test were negative.  A throat culture and COVID test are pending.  You will be contacted if the pending test results are positive.  You will also have access to results via MyChart. Take medication as prescribed. You may take Robitussin or Mucinex (plain with no alcohol) for your cough. Continue Tylenol as needed for pain, fever, or general discomfort. Increase fluids and allow for plenty of rest. Recommend normal saline nasal spray throughout the day to help with nasal congestion and runny nose. Warm salt water gargles 3-4 times daily as needed for throat pain or discomfort. For your cough, recommend using a humidifier in your bedroom at nighttime during sleep and sleeping elevated on pillows while cough symptoms persist. If symptoms fail to improve over the next 7 to 10 days, or suddenly worsening, please follow-up in this clinic or with your primary care physician or obstetrician for further evaluation. Follow-up as needed.

## 2023-03-06 NOTE — ED Provider Notes (Signed)
[facility]    CSN: [id number] Arrival date & time: 03/06/23  1339      History   Chief Complaint Chief Complaint  Patient presents with   Sore Throat    HPI Elaine Lopez is a 32 y.o. female.   The history is provided by the patient.   Patient presents with a 3-day history of fatigue, subjective fever, body aches, sore throat, nasal congestion, runny nose, and cough.  Patient denies ear pain, ear drainage, wheezing, difficulty breathing, chest pain, abdominal pain, nausea, vomiting, or diarrhea.  Patient reports that there is something "going on at work".  Patient has been taking Tylenol at home for her symptoms.  Patient is [redacted] weeks pregnant.  States that she took a COVID test at home, but would like the test to be repeated today.  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 Authorizing Provider  fluticasone (FLONASE) 50 MCG/ACT nasal spray Place 2 sprays into both nostrils daily. 03/06/23  Yes June Vacha-Warren, Sadie Haber, NP  busPIRone (BUSPAR) 15 MG tablet Take 1 tablet (15 mg total) by mouth 2 (two) times daily. 10/03/22   Gabriel Earing, FNP  desvenlafaxine (PRISTIQ) 50 MG 24 hr tablet Take 1 tablet by mouth once daily 11/14/22   Gabriel Earing, FNP  gabapentin (NEURONTIN) 300 MG capsule Take one capsule night x 1 week. Then one capsule every other night for 1 week. Then discontinue. 08/09/22   Gabriel Earing, FNP  hydrOXYzine (ATARAX) 10 MG tablet Take 1 tablet by mouth at bedtime. Patient not taking: Reported on 08/09/2022    [provider]  Melatonin 10 MG CAPS Take 2 capsules by mouth at bedtime.     [provider]  Omeprazole 20 MG TBEC Take 20 mg by mouth daily.    [provider]  Prenatal Vit-Fe Fumarate-FA (PRENATAL MULTIVITAMIN) TABS tablet Take 1 tablet by mouth daily at 12 noon.    [provider]  temazepam (RESTORIL) 15 MG capsule Take 1 capsule (15 mg total) by mouth at bedtime as needed for sleep. 08/09/22   Gabriel Earing, FNP  temazepam (RESTORIL) 22.5 MG capsule Take 1 capsule (22.5 mg total) by mouth at bedtime as needed for sleep. 08/09/22   Gabriel Earing, FNP  temazepam (RESTORIL) 7.5 MG capsule Take 1 capsule nightly x 1 week. Then take 1 capsule every other night for 1 week. 08/09/22   Gabriel Earing, FNP  VITAMIN D, CHOLECALCIFEROL, PO Take 1,000 Units by mouth daily.    [provider]    Family History Family History  Problem Relation Age of Onset   Thyroid disease Mother    Hypertension Mother    Depression Mother    Vascular Disease Mother    Depression Father    Cancer Sister        ewings sarcoma   Heart attack Maternal Grandfather    COPD Paternal Grandfather    Heart disease Paternal Grandfather     Social History Social History   Tobacco Use    Smoking status: Former    Current packs/day: 0.00    Types: Cigarettes    Quit date: 2020    Years since quitting: 4.7   Smokeless tobacco: Never   Tobacco comments:    maybe one a day   Vaping Use   Vaping status: Every Day  Substance Use Topics   Alcohol use: Not Currently   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     Allergies   Patient has no known allergies.   Review of Systems Review of Systems Per HPI  Physical Exam Triage Vital Signs ED Triage Vitals [03/06/23 1502]  Encounter Vitals Group     BP 131/89     Systolic BP Percentile      Diastolic BP Percentile      Pulse Rate (!) 109     Resp 16     Temp 98.1 F (36.7 C)     Temp Source Oral     SpO2 97 %     Weight      Height      Head Circumference      Peak Flow      Pain Score 4     Pain Loc      Pain Education      Exclude from Growth Chart    No data found.  Updated Vital Signs BP 131/89 (BP Location: Right Arm)   Pulse (!) 109   Temp 98.1 F (36.7 C) (Oral)   Resp 16   LMP 06/23/2022 (Approximate) Comment: + home pregancy test today  SpO2 97%   Visual Acuity Right Eye Distance:   Left Eye Distance:   Bilateral Distance:    Right Eye Near:   Left Eye Near:    Bilateral Near:     Physical Exam Vitals and nursing note reviewed.  Constitutional:      General: She is not in acute distress.    Appearance: Normal appearance.  HENT:     Head: Normocephalic.     Right Ear: Tympanic membrane, ear canal and external ear normal.     Left Ear: Tympanic membrane, ear canal and external ear normal.     Nose: Congestion present.     Right Turbinates: Enlarged and swollen.     Left Turbinates: Enlarged and swollen.     Right Sinus:  No maxillary sinus tenderness or frontal sinus tenderness.     Left Sinus: No maxillary sinus tenderness or frontal sinus tenderness.     Mouth/Throat:     Lips: Pink.     Mouth: Mucous membranes are  moist.     Pharynx: Uvula midline. Posterior oropharyngeal erythema and postnasal drip present. No pharyngeal swelling, oropharyngeal exudate or uvula swelling.  Eyes:     Extraocular Movements: Extraocular movements intact.     Conjunctiva/sclera: Conjunctivae normal.     Pupils: Pupils are equal, round, and reactive to light.  Cardiovascular:     Rate and Rhythm: Normal rate and regular rhythm.     Pulses: Normal pulses.     Heart sounds: Normal heart sounds.  Pulmonary:     Effort: Pulmonary effort is normal. No respiratory distress.     Breath sounds: Normal breath sounds. No stridor. No wheezing or rales.  Abdominal:     General: Bowel sounds are normal.     Palpations: Abdomen is soft.     Tenderness: There is no abdominal tenderness.  Musculoskeletal:     Cervical back: Normal range of motion.  Lymphadenopathy:     Cervical: No cervical adenopathy.  Skin:    General: Skin is warm and dry.  Neurological:     General: No focal deficit present.     Mental Status: She is alert and oriented to person, place, and time.  Psychiatric:        Mood and Affect: Mood normal.        Behavior: Behavior normal.      UC Treatments / Results  Labs (all labs ordered are listed, but only abnormal results are displayed) Labs Reviewed  SARS CORONAVIRUS 2 (TAT 6-24 HRS)  CULTURE, GROUP A STREP [facility])  POCT INFLUENZA A/B  POCT RAPID STREP A (OFFICE)    EKG   Radiology No results found.  Procedures Procedures (including critical care time)  Medications Ordered in UC Medications - No data to display  Initial Impression / Assessment and Plan / UC Course  I have reviewed the triage vital signs and the nursing notes.  Pertinent labs & imaging results that were available during my care of the patient were reviewed by me and considered in my medical decision making (see chart for details).  The patient is well-appearing, he is in no acute distress, vital signs are  stable.  Rapid strep test and influenza test were negative.  COVID test and throat culture are pending.  Research regarding COVID in pregnancy, no evidence to support safe use of Paxlovid, advised patient of same.  Suspect symptoms are related to viral upper respiratory infection, suspect COVID given his most recent exposure.  Will provide symptomatic treatment with Bromfed-DM for the cough and nasal congestion, and ipratropium 0.3% nasal spray for nasal congestion.  Supportive care recommendations were provided and discussed with the patient to include increasing fluids, allowing for plenty of rest, over-the-counter analgesics, normal saline nasal spray, and warm salt water gargles.  Discussed viral etiology with the patient and when follow-up may be indicated.  Also discussed with patient that if his test is positive for COVID, he should remain home until his been fever free for 24 hours with no medication.  Patient is in agreement with this plan of care and verbalizes understanding.  All questions were answered.  Patient stable for discharge.  Work note was provided.   Final Clinical Impressions(s) / UC Diagnoses   Final diagnoses:  Viral upper respiratory  tract infection with cough  Encounter for screening for COVID-19     Discharge Instructions      The rapid strep test and influenza test were negative.  A throat culture and COVID test are pending.  You will be contacted if the pending test results are positive.  You will also have access to results via MyChart. Take medication as prescribed. You may take Robitussin or Mucinex (plain with no alcohol) for your cough. Continue Tylenol as needed for pain, fever, or general discomfort. Increase fluids and allow for plenty of rest. Recommend normal saline nasal spray throughout the day to help with nasal congestion and runny nose. Warm salt water gargles 3-4 times daily as needed for throat pain or discomfort. For your cough, recommend using a  humidifier in your bedroom at nighttime during sleep and sleeping elevated on pillows while cough symptoms persist. If symptoms fail to improve over the next 7 to 10 days, or suddenly worsening, please follow-up in this clinic or with your primary care physician or obstetrician for further evaluation. Follow-up as needed.     ED Prescriptions     Medication Sig Dispense Auth. Provider   fluticasone (FLONASE) 50 MCG/ACT nasal spray Place 2 sprays into both nostrils daily. 16 g Deivi Huckins-Warren, Sadie Haber, NP      PDMP not reviewed this encounter.   Abran Cantor, NP 03/06/23 1630

## 2023-03-06 NOTE — ED Triage Notes (Signed)
Pt states sore throat,runny nose,cough and fatigue for the past 3 days.  Has been taking tylenol at home. Pt is [redacted] weeks pregnant.

## 2023-03-07 LAB — SARS CORONAVIRUS 2 (TAT 6-24 HRS): SARS Coronavirus 2: NEGATIVE

## 2023-03-10 LAB — CULTURE, GROUP A STREP (THRC)

## 2023-03-11 DIAGNOSIS — O0993 Supervision of high risk pregnancy, unspecified, third trimester: Secondary | ICD-10-CM | POA: Diagnosis not present

## 2023-03-11 DIAGNOSIS — Z3A37 37 weeks gestation of pregnancy: Secondary | ICD-10-CM | POA: Diagnosis not present

## 2023-03-18 DIAGNOSIS — Z3A38 38 weeks gestation of pregnancy: Secondary | ICD-10-CM | POA: Diagnosis not present

## 2023-03-18 DIAGNOSIS — Z87891 Personal history of nicotine dependence: Secondary | ICD-10-CM | POA: Diagnosis not present

## 2023-03-18 DIAGNOSIS — Z780 Asymptomatic menopausal state: Secondary | ICD-10-CM | POA: Diagnosis not present

## 2023-03-18 DIAGNOSIS — F32A Depression, unspecified: Secondary | ICD-10-CM | POA: Diagnosis not present

## 2023-03-18 DIAGNOSIS — O368130 Decreased fetal movements, third trimester, not applicable or unspecified: Secondary | ICD-10-CM | POA: Diagnosis not present

## 2023-03-18 DIAGNOSIS — F4310 Post-traumatic stress disorder, unspecified: Secondary | ICD-10-CM | POA: Diagnosis not present

## 2023-03-18 DIAGNOSIS — F419 Anxiety disorder, unspecified: Secondary | ICD-10-CM | POA: Diagnosis not present

## 2023-03-18 DIAGNOSIS — Q513 Bicornate uterus: Secondary | ICD-10-CM | POA: Diagnosis not present

## 2023-03-18 DIAGNOSIS — Z79899 Other long term (current) drug therapy: Secondary | ICD-10-CM | POA: Diagnosis not present

## 2023-03-18 DIAGNOSIS — R339 Retention of urine, unspecified: Secondary | ICD-10-CM | POA: Diagnosis not present

## 2023-03-18 DIAGNOSIS — O99344 Other mental disorders complicating childbirth: Secondary | ICD-10-CM | POA: Diagnosis not present

## 2023-03-18 DIAGNOSIS — O99892 Other specified diseases and conditions complicating childbirth: Secondary | ICD-10-CM | POA: Diagnosis not present

## 2023-03-18 DIAGNOSIS — O4202 Full-term premature rupture of membranes, onset of labor within 24 hours of rupture: Secondary | ICD-10-CM | POA: Diagnosis not present

## 2023-03-18 DIAGNOSIS — O3403 Maternal care for unspecified congenital malformation of uterus, third trimester: Secondary | ICD-10-CM | POA: Diagnosis not present

## 2023-03-18 DIAGNOSIS — M5116 Intervertebral disc disorders with radiculopathy, lumbar region: Secondary | ICD-10-CM | POA: Diagnosis not present

## 2023-03-18 DIAGNOSIS — Z7982 Long term (current) use of aspirin: Secondary | ICD-10-CM | POA: Diagnosis not present

## 2023-03-18 DIAGNOSIS — Z91048 Other nonmedicinal substance allergy status: Secondary | ICD-10-CM | POA: Diagnosis not present

## 2023-03-18 DIAGNOSIS — O4292 Full-term premature rupture of membranes, unspecified as to length of time between rupture and onset of labor: Secondary | ICD-10-CM | POA: Diagnosis not present

## 2023-03-18 DIAGNOSIS — O134 Gestational [pregnancy-induced] hypertension without significant proteinuria, complicating childbirth: Secondary | ICD-10-CM | POA: Diagnosis not present

## 2023-03-18 DIAGNOSIS — R102 Pelvic and perineal pain: Secondary | ICD-10-CM | POA: Diagnosis not present

## 2023-03-18 DIAGNOSIS — Z141 Cystic fibrosis carrier: Secondary | ICD-10-CM | POA: Diagnosis not present

## 2023-05-09 ENCOUNTER — Ambulatory Visit: Payer: Medicaid Other | Admitting: Family Medicine

## 2023-05-09 ENCOUNTER — Encounter: Payer: Self-pay | Admitting: Family Medicine

## 2023-05-09 VITALS — BP 113/69 | HR 80 | Temp 98.0°F | Ht 70.0 in | Wt 239.5 lb

## 2023-05-09 DIAGNOSIS — F339 Major depressive disorder, recurrent, unspecified: Secondary | ICD-10-CM

## 2023-05-09 DIAGNOSIS — Z1159 Encounter for screening for other viral diseases: Secondary | ICD-10-CM | POA: Diagnosis not present

## 2023-05-09 DIAGNOSIS — E0590 Thyrotoxicosis, unspecified without thyrotoxic crisis or storm: Secondary | ICD-10-CM

## 2023-05-09 DIAGNOSIS — F411 Generalized anxiety disorder: Secondary | ICD-10-CM

## 2023-05-09 MED ORDER — BUPROPION HCL ER (XL) 150 MG PO TB24: 300.0000 mg | ORAL_TABLET | Freq: Every day | ORAL | 3 refills | Status: DC

## 2023-05-09 MED ORDER — BUSPIRONE HCL 15 MG PO TABS: 15.0000 mg | ORAL_TABLET | Freq: Two times a day (BID) | ORAL | 3 refills | Status: DC

## 2023-05-09 MED ORDER — DESVENLAFAXINE SUCCINATE ER 50 MG PO TB24: 50.0000 mg | ORAL_TABLET | Freq: Every day | ORAL | 3 refills | Status: DC

## 2023-05-09 NOTE — Progress Notes (Signed)
Established Patient Office Visit  Subjective   [name], female    DOB: 1991/03/07  Age: 32 y.o. MRN: [id number]  Chief Complaint  Patient presents with   Depression    Depression        Elaine Lopez is here for a chronic follow up. She is not 7 weeks postpartum. She had her postpartum OB check on 04/19/23. She did have a blood transfusion after delivery. She is breastfeeding and has been working with lactation due to latching difficulties.   She reports feeling very fatigued despite sleeping fairly week. She typically gets a 5 hours stretch at night, followed by a 3 hour stretch, and then napping during the day. Her partner and mother have been supportive as well. Wellbutrin was increased to 150 mg BID at her appt on 04/19/23. She has been out of her buspar for 4-5 days now. She feels like she is coping pretty well but that she has been hard on herself.      05/09/2023   11:16 AM 08/09/2022   11:11 AM 06/19/2022    9:45 AM  Depression screen PHQ 2/9  Decreased Interest 2 2 2   Down, Depressed, Hopeless 2 2 2   PHQ - 2 Score 4 4 4   Altered sleeping 2 3 3   Tired, decreased energy 3 3 3   Change in appetite 0 3 2  Feeling bad or failure about yourself  0 1 3  Trouble concentrating 0 2 1  Moving slowly or fidgety/restless 0 2 1  Suicidal thoughts 0 0 1  PHQ-9 Score 9 18 18   Difficult doing work/chores Very difficult Somewhat difficult Very difficult      05/09/2023   11:17 AM 08/09/2022   11:12 AM 06/19/2022    9:44 AM 04/09/2022    4:10 PM  GAD 7 : Generalized Anxiety Score  Nervous, Anxious, on Edge 2 3 2 3   Control/stop worrying 2 3 2 3   Worry too much - different things 1 3 2 2   Trouble relaxing 0 2 2 2   Restless 0 3 2 2   Easily annoyed or irritable 3 3 2 2   Afraid - awful might happen 2 2 0 1  Total GAD 7 Score 10 19 12 15   Anxiety Difficulty Somewhat difficult Very difficult  Very difficult       Review of Systems  Psychiatric/Behavioral:  Positive for  depression.    As per HPI.    Objective:     BP 113/69   Pulse 80   Temp 98 F (36.7 C) (Temporal)   Ht 5\' 10"  (1.778 m)   Wt 239 lb 8 oz (108.6 kg)   LMP 06/23/2022 (Approximate) Comment: + home pregancy test today  SpO2 98%   Breastfeeding Unknown   BMI 34.36 kg/m    Physical Exam Vitals and nursing note reviewed.  Constitutional:      General: She is not in acute distress.    Appearance: She is not ill-appearing, toxic-appearing or diaphoretic.  Cardiovascular:     Rate and Rhythm: Normal rate and regular rhythm.     Heart sounds: Normal heart sounds. No murmur heard. Pulmonary:     Effort: Pulmonary effort is normal. No respiratory distress.     Breath sounds: Normal breath sounds. No wheezing.  Musculoskeletal:     Cervical back: Neck supple. No rigidity.     Right lower leg: No edema.     Left lower leg: No edema.  Skin:    General: Skin  is warm and dry.  Neurological:     General: No focal deficit present.     Mental Status: She is alert and oriented to person, place, and time.  Psychiatric:        Mood and Affect: Mood normal.        Behavior: Behavior normal.        Thought Content: Thought content normal.        Judgment: Judgment normal.      No results found for any visits on 05/09/23.    The ASCVD Risk score (Arnett DK, et al., 2019) failed to calculate for the following reasons:   The 2019 ASCVD risk score is only valid for ages 21 to 51    Assessment & Plan:   Elaine Lopez was seen today for depression.  Diagnoses and all orders for this visit:  Depression, recurrent (HCC) Generalized anxiety disorder Stable symtpoms. Restart buspar. Wellbutrin was recently increased to BID by OB. Will have her follow up in 4 weeks, sooner for new or worsening symptoms.  -     desvenlafaxine (PRISTIQ) 50 MG 24 hr tablet; Take 1 tablet (50 mg total) by mouth daily.. -     busPIRone (BUSPAR) 15 MG tablet; Take 1 tablet (15 mg total) by mouth 2 (two) times  daily. -     buPROPion (WELLBUTRIN XL) 150 MG 24 hr tablet; Take 2 tablets (300 mg total) by mouth daily.  Postpartum state Will check CBC today.  -     CBC with Differential/Platelet  Subclinical hyperthyroidism Labs pending.  -     TSH -     T4, Free -     Thyroid peroxidase antibody  Need for hepatitis C screening test -     Hepatitis C antibody   Return in about 4 weeks (around 06/06/2023).   The patient indicates understanding of these issues and agrees with the plan.  Gabriel Earing, FNP

## 2023-05-10 LAB — CBC WITH DIFFERENTIAL/PLATELET
Basophils Absolute: 0 10*3/uL (ref 0.0–0.2)
Basos: 1 %
EOS (ABSOLUTE): 0.1 10*3/uL (ref 0.0–0.4)
Eos: 2 %
Hematocrit: 39 % (ref 34.0–46.6)
Hemoglobin: 12.3 g/dL (ref 11.1–15.9)
Immature Grans (Abs): 0 10*3/uL (ref 0.0–0.1)
Immature Granulocytes: 0 %
Lymphocytes Absolute: 1.9 10*3/uL (ref 0.7–3.1)
Lymphs: 30 %
MCH: 28.4 pg (ref 26.6–33.0)
MCHC: 31.5 g/dL (ref 31.5–35.7)
MCV: 90 fL (ref 79–97)
Monocytes Absolute: 0.5 10*3/uL (ref 0.1–0.9)
Monocytes: 8 %
Neutrophils Absolute: 3.7 10*3/uL (ref 1.4–7.0)
Neutrophils: 59 %
Platelets: 360 10*3/uL (ref 150–450)
RBC: 4.33 x10E6/uL (ref 3.77–5.28)
RDW: 13 % (ref 11.7–15.4)
WBC: 6.2 10*3/uL (ref 3.4–10.8)

## 2023-05-10 LAB — HEPATITIS C ANTIBODY: Hep C Virus Ab: NONREACTIVE

## 2023-05-10 LAB — T4, FREE: Free T4: 1.11 ng/dL (ref 0.82–1.77)

## 2023-05-10 LAB — TSH: TSH: 1.98 u[IU]/mL (ref 0.450–4.500)

## 2023-05-10 LAB — THYROID PEROXIDASE ANTIBODY: Thyroperoxidase Ab SerPl-aCnc: <9 [IU]/mL (ref 0–34)

## 2023-06-06 ENCOUNTER — Encounter: Payer: Self-pay | Admitting: Family Medicine

## 2023-06-06 ENCOUNTER — Ambulatory Visit: Payer: Medicaid Other | Admitting: Family Medicine

## 2023-06-06 VITALS — BP 106/72 | HR 90 | Temp 98.2°F | Ht 70.0 in | Wt 237.2 lb

## 2023-06-06 DIAGNOSIS — B351 Tinea unguium: Secondary | ICD-10-CM | POA: Diagnosis not present

## 2023-06-06 DIAGNOSIS — E782 Mixed hyperlipidemia: Secondary | ICD-10-CM

## 2023-06-06 DIAGNOSIS — F411 Generalized anxiety disorder: Secondary | ICD-10-CM | POA: Diagnosis not present

## 2023-06-06 DIAGNOSIS — E66811 Obesity, class 1: Secondary | ICD-10-CM | POA: Diagnosis not present

## 2023-06-06 DIAGNOSIS — F1911 Other psychoactive substance abuse, in remission: Secondary | ICD-10-CM

## 2023-06-06 DIAGNOSIS — F339 Major depressive disorder, recurrent, unspecified: Secondary | ICD-10-CM | POA: Diagnosis not present

## 2023-06-06 MED ORDER — SEMAGLUTIDE-WEIGHT MANAGEMENT 1.7 MG/0.75ML ~~LOC~~ SOAJ: 1.7000 mg | SUBCUTANEOUS | 3 refills | Status: AC

## 2023-06-06 MED ORDER — SEMAGLUTIDE-WEIGHT MANAGEMENT 2.4 MG/0.75ML ~~LOC~~ SOAJ: 2.4000 mg | SUBCUTANEOUS | 6 refills | Status: AC

## 2023-06-06 MED ORDER — SEMAGLUTIDE-WEIGHT MANAGEMENT 1 MG/0.5ML ~~LOC~~ SOAJ: 1.0000 mg | SUBCUTANEOUS | 0 refills | Status: AC

## 2023-06-06 MED ORDER — SEMAGLUTIDE-WEIGHT MANAGEMENT 0.5 MG/0.5ML ~~LOC~~ SOAJ: 0.5000 mg | SUBCUTANEOUS | 0 refills | Status: AC

## 2023-06-06 MED ORDER — SEMAGLUTIDE-WEIGHT MANAGEMENT 0.25 MG/0.5ML ~~LOC~~ SOAJ: 0.2500 mg | SUBCUTANEOUS | 0 refills | Status: AC

## 2023-06-06 MED ORDER — DESVENLAFAXINE SUCCINATE ER 100 MG PO TB24: 100.0000 mg | ORAL_TABLET | Freq: Every day | ORAL | 3 refills | Status: AC

## 2023-06-06 MED ORDER — TERBINAFINE HCL 250 MG PO TABS
250.0000 mg | ORAL_TABLET | Freq: Every day | ORAL | 0 refills | Status: DC
Start: 2023-06-06 — End: 2023-09-04

## 2023-06-06 MED ORDER — BUPROPION HCL ER (XL) 300 MG PO TB24: 300.0000 mg | ORAL_TABLET | Freq: Every day | ORAL | 3 refills | Status: AC

## 2023-06-06 NOTE — Progress Notes (Signed)
Established Patient Office Visit  Subjective   [name], female    DOB: March 18, 1991  Age: 32 y.o. MRN: [id number]  Chief Complaint  Patient presents with   Depression   Nail Problem    HPI Elaine Lopez is here for a follow up of anxiety and depression. She is currently 3 months postpartum and is no longer breastfeeding. She has been compliant with wellbutrin 300 mg daily, pristiq 50 mg daily, and buspar 15 mg BID. She is feeling better mentally overall but is reporting decreased interest and pleasure as well as anxiety and frequent worry. She feels tired but isn't sleeping well due to to caring for her newborn. She is getting 3-4 blocks of sleep for a total of about 8 hours of broken sleep. She does have trouble falling to sleep at bedtime but falls back to sleep quickly after feedings.   She also is interested in medication for nail fungus. She has thick, yellow nail on her left 3rd toe. This has been present for months. She has tried OTC options without improvement.   She is also interested in Elaine Lopez for weight loss as her insurance now covers this. She has tied and failed dieting, exercise, and phentermine in the past. She has a history of drug abuse in remission. She does have a hx of HLD.     05/09/2023   11:16 AM 08/09/2022   11:11 AM 06/19/2022    9:45 AM  Depression screen PHQ 2/9  Decreased Interest 2 2 2   Down, Depressed, Hopeless 2 2 2   PHQ - 2 Score 4 4 4   Altered sleeping 2 3 3   Tired, decreased energy 3 3 3   Change in appetite 0 3 2  Feeling bad or failure about yourself  0 1 3  Trouble concentrating 0 2 1  Moving slowly or fidgety/restless 0 2 1  Suicidal thoughts 0 0 1  PHQ-9 Score 9 18 18   Difficult doing work/chores Very difficult Somewhat difficult Very difficult      05/09/2023   11:17 AM 08/09/2022   11:12 AM 06/19/2022    9:44 AM 04/09/2022    4:10 PM  GAD 7 : Generalized Anxiety Score  Nervous, Anxious, on Edge 2 3 2 3   Control/stop worrying  2 3 2 3   Worry too much - different things 1 3 2 2   Trouble relaxing 0 2 2 2   Restless 0 3 2 2   Easily annoyed or irritable 3 3 2 2   Afraid - awful might happen 2 2 0 1  Total GAD 7 Score 10 19 12 15   Anxiety Difficulty Somewhat difficult Very difficult  Very difficult       ROS As per HPI.    Objective:     BP 106/72   Pulse 90   Temp 98.2 F (36.8 C) (Temporal)   Ht 5\' 10"  (1.778 m)   Wt 237 lb 3.2 oz (107.6 kg)   SpO2 97%   BMI 34.03 kg/m    Physical Exam Vitals and nursing note reviewed.  Constitutional:      General: She is not in acute distress.    Appearance: Normal appearance. She is not ill-appearing.  Cardiovascular:     Rate and Rhythm: Normal rate and regular rhythm.     Pulses: Normal pulses.     Heart sounds: Normal heart sounds. No murmur heard. Pulmonary:     Effort: Pulmonary effort is normal. No respiratory distress.     Breath sounds: Normal  breath sounds.  Musculoskeletal:     Cervical back: Neck supple. No tenderness.     Right lower leg: No edema.     Left lower leg: No edema.  Feet:     Left foot:     Toenail Condition: Fungal disease present. Lymphadenopathy:     Cervical: No cervical adenopathy.  Skin:    General: Skin is warm and dry.  Neurological:     General: No focal deficit present.     Mental Status: She is alert and oriented to person, place, and time.  Psychiatric:        Mood and Affect: Mood normal.        Behavior: Behavior normal.      No results found for any visits on 06/06/23.    The ASCVD Risk score (Arnett DK, et al., 2019) failed to calculate for the following reasons:   The 2019 ASCVD risk score is only valid for ages 80 to 59    Assessment & Plan:   Depression, recurrent (HCC) Generalized anxiety disorder Not well controlled. Denies SI. Continue wellbutrin, buspar. Increase pristiq to 100 mg daily. Declined referral today.  -     buPROPion HCl ER (XL); Take 1 tablet (300 mg total) by mouth daily.   Dispense: 90 tablet; Refill: 3 -     Desvenlafaxine Succinate ER; Take 1 tablet (100 mg total) by mouth daily.  Dispense: 90 tablet; Refill: 3  Toenail fungus Reviewed normal LFTs in October through care everywhere. Lamisil as below. Will recheck LFTs in 6 weeks. Discussed potential side effects and to stop medication for adverse effects.  -     Terbinafine HCl; Take 1 tablet (250 mg total) by mouth daily.  Dispense: 84 tablet; Refill: 0  Obesity, class 1 Patient's BMI is >30 mg/m2.  Patient's current BMI is Body mass index is 34.03 kg/m.Marland Kitchen  Patient is currently enrolled in a healthy eating plan along with encouraged exercise.  Patient has contraindications to phentermine, Contrave & Qsymia (contains phentermine) due to hx of drug abuse currently in remission  Patient does not have a personal or family history of medullary thyroid carcinoma (MTC) or Multiple Endocrine Neoplasia syndrome type 2 (MEN 2). -     Semaglutide-Weight Management; Inject 0.25 mg into the skin once a week for 28 days.  Dispense: 2 mL; Refill: 0 -     Semaglutide-Weight Management; Inject 0.5 mg into the skin once a week for 28 days.  Dispense: 2 mL; Refill: 0 -     Semaglutide-Weight Management; Inject 1 mg into the skin once a week for 28 days.  Dispense: 2 mL; Refill: 0 -     Semaglutide-Weight Management; Inject 1.7 mg into the skin once a week for 28 days.  Dispense: 3 mL; Refill: 3 -     Semaglutide-Weight Management; Inject 2.4 mg into the skin once a week for 28 days.  Dispense: 3 mL; Refill: 6  Mixed hyperlipidemia  History of drug abuse (HCC) In remission.     Return in about 6 weeks (around 07/18/2023) for medication follow up, recheck LFTs.   The patient indicates understanding of these issues and agrees with the plan.  Gabriel Earing, FNP

## 2023-06-07 DIAGNOSIS — E782 Mixed hyperlipidemia: Secondary | ICD-10-CM | POA: Insufficient documentation

## 2023-06-07 DIAGNOSIS — E66811 Obesity, class 1: Secondary | ICD-10-CM | POA: Insufficient documentation

## 2023-06-20 ENCOUNTER — Telehealth: Payer: Self-pay

## 2023-06-20 NOTE — Telephone Encounter (Signed)
 Pharmacy Patient Advocate Encounter   Received notification from CoverMyMeds that prior authorization for Wegovy  0.25 mg is required/requested.   Insurance verification completed.   The patient is insured through [facility] .   Per test claim: PA required; PA submitted to above mentioned insurance via CoverMyMeds Key/confirmation #/EOC [facility] Status is pending

## 2023-06-21 ENCOUNTER — Ambulatory Visit: Payer: Self-pay | Admitting: Family Medicine

## 2023-06-21 NOTE — Telephone Encounter (Signed)
 Chief Complaint: vaginal bleeding post partum Symptoms: vaginal bleeding (changing an ultra tampon q 1-1.5 hours, blood clots) Frequency: x 3 days, worsening Pertinent Negatives: Patient denies syncope, chest pain, SOB Disposition: [x] ED /[] Urgent Care (no appt availability in office) / [] Appointment(In office/virtual)/ []  [facility]/ [] Home Care/ [] Refused Recommended Disposition /[] [facility]/ []  Follow-up with PCP Additional Notes: Patient states she is 3 months post partum with a vaginal delivery. Patient states she tore and has sutures, had significant blood loss during delivery that required blood transfusion. Patient states she recently had intercourse (unsure if she tore or sutures tore) and has had severe vaginal bleeding x 3 days. Patient states she hasn't followed up with her OBGYN yet. Advised ED, pt states she will have her mother drive her.  Copied from CRM [id number]. Topic: Clinical - Red Word Triage >> Jun 21, 2023 11:41 AM Delon DASEN wrote: Red Word that prompted transfer to Nurse Triage: 3  months post partum, bleeding through feminine products in less than 2 hours, large clots, feeling tired and weak Reason for Disposition  SEVERE vaginal bleeding (e.g., soaking 2 pads or tampons per hour and present 2 or more hours; 1 menstrual cup every 2 hours)  Answer Assessment - Initial Assessment Questions 1. ONSET: When did the bleeding start? Describe your bleeding: is it getting worse, staying the same, improving, or stopping and starting?     X 3 days. Patient states bleeding has gotten bleeding.  2. AMOUNT: How much bleeding are you having today?     - SPOTTING: spotting, or pinkish / brownish mucous discharge; does not fill panty liner or pad    - MILD:  less than 1 pad / hour; less than patient's usual menstrual bleeding   - MODERATE: 1-2 pads / hour; 1 menstrual cup every 6 hours; small-medium blood clots (e.g., pea, grape, small coin)   - SEVERE:  soaking 2 or more pads/hour for 2 or more hours; 1 menstrual cup every 2 hours; bleeding not contained by pads or continuous red blood from vagina; large blood clots (e.g., golf ball, large coin)      Bleeding through ultrasized tampons within less than 2 hours. Large clots bigger than a quarter and unsure if it has been as big as a golf ball. Sometimes dark red to bright red blood.  3. ABDOMEN PAIN: Do you have any pain? How bad is the pain? What does it keep you from doing?     - MILD -  doesn't interfere with normal activities, abdomen soft and not tender to touch     - MODERATE - interferes with normal activities or awakens from sleep, abdomen tender to touch     - SEVERE - excruciating pain, doubled over, unable to do any normal activities       Severe cramps and pelvic pain.  4. HORMONES: Are you taking any birth control medicines? (e.g., birth control pills, Depo-Provera )     Denies, pt states she was waiting til her post partum visit before getting an IUD put in.   5. BLOOD THINNERS: Do you take any blood thinners? (e.g., aspirin, enoxaparin / Lovenox, warfarin / Coumadin)     Denies.  6. OTHER SYMPTOMS: Do you have any other symptoms? (e.g., fever, chills, dizziness)     Feeling really tired and weak, feel weird  7. DELIVERY DATE: When was your delivery date? Vaginal delivery or C-section?     3 months post partum. 03/18/2023 vaginal delivery. Patient states  she hemorrhaged badly during delivery losing 2L of blood and required blood transfusion.  8. BREASTFEEDING: Are you breastfeeding?     Patient states she is currently not breastfeeding.  Protocols used: Postpartum - Vaginal Bleeding and Lochia-A-AH

## 2023-06-25 ENCOUNTER — Other Ambulatory Visit (HOSPITAL_COMMUNITY): Payer: Self-pay

## 2023-06-25 NOTE — Telephone Encounter (Signed)
 Pharmacy Patient Advocate Encounter  Received notification from [employer] that Prior Authorization for Wegovy  0.5MG /0.5ML auto-injectors has been DENIED.  Full denial letter will be uploaded to the media tab. See denial reason below.   PA #/Case ID/Reference #: [id number]

## 2023-06-26 NOTE — Telephone Encounter (Signed)
 Updated OV note

## 2023-07-04 ENCOUNTER — Telehealth (INDEPENDENT_AMBULATORY_CARE_PROVIDER_SITE_OTHER): Payer: Medicaid Other | Admitting: Family Medicine

## 2023-07-04 ENCOUNTER — Telehealth: Payer: Self-pay

## 2023-07-04 ENCOUNTER — Encounter: Payer: Self-pay | Admitting: Family Medicine

## 2023-07-04 DIAGNOSIS — N939 Abnormal uterine and vaginal bleeding, unspecified: Secondary | ICD-10-CM

## 2023-07-04 DIAGNOSIS — Z30011 Encounter for initial prescription of contraceptive pills: Secondary | ICD-10-CM | POA: Diagnosis not present

## 2023-07-04 DIAGNOSIS — Z8742 Personal history of other diseases of the female genital tract: Secondary | ICD-10-CM | POA: Diagnosis not present

## 2023-07-04 MED ORDER — DROSPIRENONE-ETHINYL ESTRADIOL 3-0.02 MG PO TABS: 1.0000 | ORAL_TABLET | Freq: Every day | ORAL | 11 refills | Status: AC

## 2023-07-04 NOTE — Progress Notes (Signed)
Virtual Visit  Note Due to COVID-19 pandemic this visit was conducted virtually. This visit type was conducted due to national recommendations for restrictions regarding the COVID-19 Pandemic (e.g. social distancing, sheltering in place) in an effort to limit this patient's exposure and mitigate transmission in our community. All issues noted in this document were discussed and addressed.  A physical exam was not performed with this format.  I connected with Elaine Lopez on 07/04/23 at 1114 by telephone and verified that I am speaking with the correct person using two identifiers. Elaine Lopez is currently located at home and no is currently with her during the visit. The provider, Gabriel Earing, FNP is located in their office at time of visit.  I discussed the limitations, risks, security and privacy concerns of performing an evaluation and management service by telephone and the availability of in person appointments. I also discussed with the patient that there may be a patient responsible charge related to this service. The patient expressed understanding and agreed to proceed.  CC: vaginal bleeding  History and Present Illness:  HPI Elaine Lopez was seen at [facility] ER on 06/21/23 for heavy vaginal bleeding. She had a pelvic US that was normal. CBC that was normal. She was discharged home with provera for 7 days. She reports that she had light bleeding for 4 days after starting provera. She had a small amount of spotting after stopped provera and no vaginal bleeding since. She has been unable to follow up with GYN as they no longer accept her insurance. Ultimately she is interested in an IUD but would like to start OCPs until this is set up. She has been on OCPs before. No contraindications. Hx of endometriosis. Denies dizziness or lightheadedness.    ROS As per HPI.   Observations/Objective: Alert and oriented. Respirations unlabored. No cyanosis. Non toxic appearing. Normal mood and  behavior.    Assessment and Plan: Diagnoses and all orders for this visit:  Abnormal uterine bleeding (AUB) -     Ambulatory referral to Gynecology -     drospirenone-ethinyl estradiol (YAZ) 3-0.02 MG tablet; Take 1 tablet by mouth daily.  History of endometriosis -     Ambulatory referral to Gynecology -     drospirenone-ethinyl estradiol (YAZ) 3-0.02 MG tablet; Take 1 tablet by mouth daily.  Encounter for oral contraception initial prescription -     drospirenone-ethinyl estradiol (YAZ) 3-0.02 MG tablet; Take 1 tablet by mouth daily.   Reviewed ER records, imaging report, labs. Referral to GYN discussed and placed. OCPs discussed and ordered. Discussed Sunday start after next cycle.   Follow Up Instructions: Return to office for new or worsening symptoms, or if symptoms persist.     I discussed the assessment and treatment plan with the patient. The patient was provided an opportunity to ask questions and all were answered. The patient agreed with the plan and demonstrated an understanding of the instructions.   The patient was advised to call back or seek an in-person evaluation if the symptoms worsen or if the condition fails to improve as anticipated.  The above assessment and management plan was discussed with the patient. The patient verbalized understanding of and has agreed to the management plan. Patient is aware to call the clinic if symptoms persist or worsen. Patient is aware when to return to the clinic for a follow-up visit. Patient educated on when it is appropriate to go to the emergency department.   Time call ended:  1122  I provided 8 minutes of  non face-to-face time during this encounter.    Gabriel Earing, FNP

## 2023-07-04 NOTE — Telephone Encounter (Signed)
Please see OV note from 06/06/2023 for PA on [place] and reprocess PA.

## 2023-07-04 NOTE — Telephone Encounter (Signed)
PA request has been Submitted. New Encounter created for follow up. For additional info see Pharmacy Prior Auth telephone encounter from 07/04/23.

## 2023-07-04 NOTE — Telephone Encounter (Signed)
Pharmacy Patient Advocate Encounter   Received notification from Pt Calls Messages that prior authorization for Wegovy 0.5MG /0.5ML auto-injectors is required/requested.   Insurance verification completed.   The patient is insured through [facility] .   Per test claim: PA required; PA submitted to above mentioned insurance via CoverMyMeds Key/confirmation #/EOC [facility] Status is pending

## 2023-07-05 ENCOUNTER — Other Ambulatory Visit (HOSPITAL_COMMUNITY): Payer: Self-pay

## 2023-07-05 NOTE — Telephone Encounter (Signed)
Pharmacy Patient Advocate Encounter  Received notification from [facility] that Prior Authorization for [facility] 0.5MG /0.5ML auto-injectors  has been APPROVED from 07/04/23 to 01/01/24. Ran test claim, Copay is $0. This test claim was processed through [facility]- copay amounts may vary at other pharmacies due to pharmacy/plan contracts, or as the patient moves through the different stages of their insurance plan.   PA #/Case ID/Reference #: [id number]

## 2023-07-05 NOTE — Telephone Encounter (Signed)
Pt aware of PA approval

## 2023-07-18 ENCOUNTER — Ambulatory Visit: Payer: Self-pay | Admitting: Family Medicine

## 2023-07-18 ENCOUNTER — Ambulatory Visit: Payer: Medicaid Other | Admitting: Family Medicine

## 2023-07-18 NOTE — Telephone Encounter (Signed)
Copied from CRM [id number]. Topic: Clinical - Red Word Triage >> Jul 18, 2023  8:40 AM Adelina Mings wrote: [facility] that prompted transfer to Nurse Triage: extreme pain and bleeding from vagina, having a heavy menstrual cycle.  Chief Complaint: Patient reports she is having extreme pelvic pain. Currently she is bleeding from vagina, having a heavy menstrual cycle.  Symptoms: Pelvic  Pain  rates  10 with out medication and 7  with medication Frequency:  x 5 days Pertinent Negatives: Patient denies fever. She has not check. Patient  Disposition: [x] ED /[] Urgent Care (no appt availability in office) / [] Appointment(In office/virtual)/ []  [facility]/ [] Home Care/ [] Refused Recommended Disposition /[] [facility]/ []  Follow-up with PCP Additional Notes:    Sent to the Emergency Room. Encouraged to follow up with her provided.  Reason for Disposition  [1] SEVERE pelvic pain AND [2] present > 1 hour  Answer Assessment - Initial Assessment Questions 1. LOCATION: "Where does it hurt?"        Left  side pelvic pain 2. RADIATION: "Does the pain shoot anywhere else?" (e.g., lower back, groin, thighs)     Radiates to her rectum 3. ONSET: "When did the pain begin?" (e.g., minutes, hours or days ago)       Friday and  it has gotten worse 4. SUDDEN: "Gradual or sudden onset?"      Gradually for her cramps but the really sharp pain woke her this morning. 5. PATTERN "Does the pain come and go, or is it constant?"     - If constant: "Is it getting better, staying the same, or worsening?"      (Note: Constant means the pain never goes away completely; most serious pain is constant and gets worse over time)     - If intermittent: "How long does it last?" "Do you have pain now?"     (Note: Intermittent means the pain goes away completely between bouts)      6. SEVERITY: "How bad is the pain?"  (e.g., Scale 1-10; mild, moderate, or severe)      - SEVERE (8-10): excruciating pain, doubled  over, unable to do any normal activities      Rates it as a 7 with  Tylenol 1000 mg and Ibuprofen 800 mg 7. RECURRENT SYMPTOM: "Have you ever had this type of pelvic pain before?" If Yes, ask: "When was the last time?" and "What happened that time?"         Never had sharp a or isolated pain. Last month blood had abnormal bleeding and they sent her to to the ER. 8. CAUSE: "What do you think is causing the pelvic pain?"      Need Provera prescription 9. RELIEVING/AGGRAVATING FACTORS: "What makes it better or worse?" (e.g., activity/rest, sexual intercourse, voiding, passing stool)      Unsure 10. OTHER SYMPTOMS: "Has there been any other symptoms?" (e.g., fever, constipation, diarrhea, urine problems, vaginal bleeding, vaginal discharge, or vomiting?"  Vaginal bleeding  , feel weak        11. PREGNANCY: "Is there any chance you are pregnant?" "When was your last menstrual period?"        Denies- currently having an abnormal cycle  Protocols used: Pelvic Pain - [facility]

## 2023-07-19 ENCOUNTER — Encounter: Payer: Self-pay | Admitting: Family Medicine

## 2023-07-25 ENCOUNTER — Ambulatory Visit: Payer: Medicaid Other | Admitting: Family Medicine

## 2023-08-13 ENCOUNTER — Other Ambulatory Visit: Payer: Self-pay | Admitting: Family Medicine

## 2023-08-13 DIAGNOSIS — E66811 Obesity, class 1: Secondary | ICD-10-CM

## 2023-09-04 ENCOUNTER — Ambulatory Visit (INDEPENDENT_AMBULATORY_CARE_PROVIDER_SITE_OTHER): Admitting: Family Medicine

## 2023-09-04 VITALS — BP 116/80 | HR 100 | Temp 97.9°F | Ht 70.0 in | Wt 227.0 lb

## 2023-09-04 DIAGNOSIS — E66811 Obesity, class 1: Secondary | ICD-10-CM

## 2023-09-04 DIAGNOSIS — N939 Abnormal uterine and vaginal bleeding, unspecified: Secondary | ICD-10-CM

## 2023-09-04 DIAGNOSIS — Z8742 Personal history of other diseases of the female genital tract: Secondary | ICD-10-CM | POA: Diagnosis not present

## 2023-09-04 DIAGNOSIS — F339 Major depressive disorder, recurrent, unspecified: Secondary | ICD-10-CM | POA: Diagnosis not present

## 2023-09-04 DIAGNOSIS — F411 Generalized anxiety disorder: Secondary | ICD-10-CM

## 2023-09-04 MED ORDER — MEDROXYPROGESTERONE ACETATE 5 MG PO TABS: 5.0000 mg | ORAL_TABLET | Freq: Every day | ORAL | 0 refills | Status: AC

## 2023-09-04 MED ORDER — PROPRANOLOL HCL 10 MG PO TABS: 10.0000 mg | ORAL_TABLET | Freq: Three times a day (TID) | ORAL | 1 refills | Status: DC | PRN

## 2023-09-04 MED ORDER — WEGOVY 1 MG/0.5ML ~~LOC~~ SOAJ: 1.0000 mg | SUBCUTANEOUS | 0 refills | Status: AC

## 2023-09-04 MED ORDER — WEGOVY 0.5 MG/0.5ML ~~LOC~~ SOAJ: 0.5000 mg | SUBCUTANEOUS | 0 refills | Status: AC

## 2023-09-04 NOTE — Progress Notes (Signed)
 Established Patient Office Visit  Subjective   [name], female    DOB: 1991-04-29  Age: 33 y.o. MRN: [id number]  Chief Complaint  Patient presents with   Obesity   abnormal uterine bleeding    HPI Elaine Lopez is here for follow up of obesity after starting on wegovy. Pharmacy filled 0.5 mg for her first. She did this for 2 weeks but felt terrible with side effects. She then did 0.25 mg x 4 weeks. Has now completed 2 weeks of the 0.5 mg. She is having constipation. She is taking a daily stool softener. Using miralax 1-2x a week sometimes if she hasn't had a BM after 3 days. No other significant side effects. Has lost 10 lbs since starting this. Doing well with diet. Trying to exercise when she can.  She has had vaginal bleeding everyday for the last 3 months. Some days it is bright red with clots- having to change super plus tampon in 4 hours. Some days it is small amounts of bleeding. Didn't go through with referral to GYN because she has moved to [place] but is [place]. Has [place] medicaid until the end of the month. Had normal vaginal [place] and CT recnelty in the ER.  Complaint with Yaz. Having intermittent dizziness and lightheadedness. Having a lot of cramps. Had some short term improvement with provera after ER visit.   Compliant with pristiq, wellbutrin, buspar. Gets really anxious that something will happen to her or her baby's father and her baby will have to grow up without a parent. Taking hydroxzine 25 mg prn but this causes a lot of grogginess.      09/04/2023    2:48 PM 05/09/2023   11:16 AM 08/09/2022   11:11 AM  Depression screen PHQ 2/9  Decreased Interest 1 2 2   Down, Depressed, Hopeless 1 2 2   PHQ - 2 Score 2 4 4   Altered sleeping 3 2 3   Tired, decreased energy 3 3 3   Change in appetite 1 0 3  Feeling bad or failure about yourself  0 0 1  Trouble concentrating 0 0 2  Moving slowly or fidgety/restless 0 0 2  Suicidal thoughts 0 0 0   PHQ-9 Score 9 9 18   Difficult doing work/chores Somewhat difficult Very difficult Somewhat difficult      09/04/2023    2:49 PM 05/09/2023   11:17 AM 08/09/2022   11:12 AM 06/19/2022    9:44 AM  GAD 7 : Generalized Anxiety Score  Nervous, Anxious, on Edge 3 2 3 2   Control/stop worrying 3 2 3 2   Worry too much - different things 3 1 3 2   Trouble relaxing 1 0 2 2  Restless 2 0 3 2  Easily annoyed or irritable 0 3 3 2   Afraid - awful might happen 3 2 2  0  Total GAD 7 Score 15 10 19 12   Anxiety Difficulty Somewhat difficult Somewhat difficult Very difficult        ROS As per HPI.    Objective:     BP 116/80   Pulse 100   Temp 97.9 F (36.6 C) (Temporal)   Ht 5\' 10"  (1.778 m)   Wt 227 lb (103 kg)   SpO2 96%   BMI 32.57 kg/m  Wt Readings from Last 3 Encounters:  09/04/23 227 lb (103 kg)  06/06/23 237 lb 3.2 oz (107.6 kg)  05/09/23 239 lb 8 oz (108.6 kg)  Physical Exam Vitals and nursing note reviewed.  Constitutional:      General: She is not in acute distress.    Appearance: Normal appearance. She is not ill-appearing.  Cardiovascular:     Rate and Rhythm: Normal rate and regular rhythm.     Pulses: Normal pulses.     Heart sounds: Normal heart sounds. No murmur heard. Pulmonary:     Effort: Pulmonary effort is normal. No respiratory distress.     Breath sounds: Normal breath sounds.  Abdominal:     General: Bowel sounds are normal. There is no distension.     Palpations: Abdomen is soft. There is no mass.     Tenderness: There is no abdominal tenderness. There is no guarding or rebound.  Musculoskeletal:     Cervical back: Neck supple. No tenderness.     Right lower leg: No edema.     Left lower leg: No edema.  Lymphadenopathy:     Cervical: No cervical adenopathy.  Skin:    General: Skin is warm and dry.  Neurological:     General: No focal deficit present.     Mental Status: She is alert and oriented to person, place, and time.  Psychiatric:         Mood and Affect: Mood normal.        Behavior: Behavior normal.      No results found for any visits on 09/04/23.    The ASCVD Risk score (Arnett DK, et al., 2019) failed to calculate for the following reasons:   The 2019 ASCVD risk score is only valid for ages 59 to 32    Assessment & Plan:   Elaine Lopez was seen today for obesity and abnormal uterine bleeding.  Diagnoses and all orders for this visit:  Abnormal uterine bleeding (AUB) History of endometriosis Will try a lower dose of provera as below so she can continue yaz labs pending. Discussed when to seek emergency care. Had had recent normal pelvic US, pelvix exam, and CT. Needs GYN referral but she declines until she has new insurance established next month. Will check labs as below. She will let me know as soon as new insurance is effective for urgent referral.  -     medroxyPROGESTERone (PROVERA) 5 MG tablet; Take 1 tablet (5 mg total) by mouth daily for 10 days. -     Anemia Profile B -     CMP14+EGFR -     TSH  Depression, recurrent (HCC) Generalized anxiety disorder Continue current regimen. Can try propranolol prn as hydroxyzine causes drowsiness. Discussed referral to [facility] when new insurance is effective. Denies SI.  -     propranolol (INDERAL) 10 MG tablet; Take 1 tablet (10 mg total) by mouth 3 (three) times daily as needed.  Obesity, class 1 Continue wegovy 0.5 mg x 4 week, then increase to 1 mg. Discussed constipation management. Diet and exercise. Down 10 lbs so far.  -     Semaglutide-Weight Management (WEGOVY) 0.5 MG/0.5ML SOAJ; Inject 0.5 mg into the skin once a week. -     Semaglutide-Weight Management (WEGOVY) 1 MG/0.5ML SOAJ; Inject 1 mg into the skin once a week.   Will determine follow up pending labs.   The patient indicates understanding of these issues and agrees with the plan.   Gabriel Earing, FNP

## 2023-09-05 LAB — ANEMIA PROFILE B
Basophils Absolute: 0 10*3/uL (ref 0.0–0.2)
Basos: 0 %
EOS (ABSOLUTE): 0.2 10*3/uL (ref 0.0–0.4)
Eos: 2 %
Ferritin: 50 ng/mL (ref 15–150)
Folate: 18.2 ng/mL (ref >3.0)
Hematocrit: 37.2 % (ref 34.0–46.6)
Hemoglobin: 12.5 g/dL (ref 11.1–15.9)
Immature Grans (Abs): 0 10*3/uL (ref 0.0–0.1)
Immature Granulocytes: 0 %
Iron Saturation: 10 % — ABNORMAL LOW (ref 15–55)
Iron: 34 ug/dL (ref 27–159)
Lymphocytes Absolute: 1.9 10*3/uL (ref 0.7–3.1)
Lymphs: 25 %
MCH: 30.2 pg (ref 26.6–33.0)
MCHC: 33.6 g/dL (ref 31.5–35.7)
MCV: 90 fL (ref 79–97)
Monocytes Absolute: 0.7 10*3/uL (ref 0.1–0.9)
Monocytes: 9 %
Neutrophils Absolute: 4.8 10*3/uL (ref 1.4–7.0)
Neutrophils: 64 %
Platelets: 377 10*3/uL (ref 150–450)
RBC: 4.14 x10E6/uL (ref 3.77–5.28)
RDW: 12.1 % (ref 11.7–15.4)
Retic Ct Pct: 1.5 % (ref 0.6–2.6)
Total Iron Binding Capacity: 336 ug/dL (ref 250–450)
UIBC: 302 ug/dL (ref 131–425)
Vitamin B-12: 438 pg/mL (ref 232–1245)
WBC: 7.6 10*3/uL (ref 3.4–10.8)

## 2023-09-05 LAB — CMP14+EGFR
ALT: 19 IU/L (ref 0–32)
AST: 19 IU/L (ref 0–40)
Albumin: 4.3 g/dL (ref 3.9–4.9)
Alkaline Phosphatase: 88 IU/L (ref 44–121)
BUN/Creatinine Ratio: 20 (ref 9–23)
BUN: 12 mg/dL (ref 6–20)
Bilirubin Total: <0.2 mg/dL (ref 0.0–1.2)
CO2: 21 mmol/L (ref 20–29)
Calcium: 9.5 mg/dL (ref 8.7–10.2)
Chloride: 105 mmol/L (ref 96–106)
Creatinine, Ser: 0.59 mg/dL (ref 0.57–1.00)
Globulin, Total: 2.6 g/dL (ref 1.5–4.5)
Glucose: 87 mg/dL (ref 70–99)
Potassium: 4.2 mmol/L (ref 3.5–5.2)
Sodium: 140 mmol/L (ref 134–144)
Total Protein: 6.9 g/dL (ref 6.0–8.5)
eGFR: 123 mL/min/{1.73_m2} (ref >59)

## 2023-09-05 LAB — TSH: TSH: 1.33 u[IU]/mL (ref 0.450–4.500)

## 2023-09-06 ENCOUNTER — Encounter: Payer: Self-pay | Admitting: Family Medicine

## 2023-09-09 ENCOUNTER — Encounter: Payer: Self-pay | Admitting: Family Medicine

## 2023-10-15 ENCOUNTER — Other Ambulatory Visit (HOSPITAL_COMMUNITY): Payer: Self-pay

## 2023-10-15 ENCOUNTER — Telehealth: Payer: Self-pay | Admitting: Pharmacy Technician

## 2023-10-15 NOTE — Telephone Encounter (Signed)
 Pharmacy Patient Advocate Encounter   Received notification from CoverMyMeds that prior authorization for Wegovy  1.7MG /0.75ML auto-injectors is required/requested.   Insurance verification completed.   The patient is insured through  [place] MEDICAID  .   Per test claim: She no longer has [place] Medicaid and according to [facility] she has [place] medicaid so I called the pt and left a message I need to confirm her insurance before I can submit the PA

## 2023-10-15 NOTE — Telephone Encounter (Signed)
 Pharmacy Patient Advocate Encounter  Received notification from  [facility]  that Prior Authorization forWegovy 1.7MG /0.75ML auto-injectorshas been APPROVED from 10-15-2023 to 10-16-2023   PA #/Case ID/Reference #: [id number]

## 2023-10-15 NOTE — Telephone Encounter (Signed)
 Pharmacy Patient Advocate Encounter   Received notification from CoverMyMeds that prior authorization for Wegovy  1.7MG /0.75ML auto-injectors is required/requested.   Insurance verification completed.   The patient is insured through [employer] .   Per test claim: PA required; PA submitted to above mentioned insurance via CoverMyMeds Key/confirmation #/EOC [id number] Status is pending

## 2023-10-15 NOTE — Telephone Encounter (Signed)
 Pt notified via MyChart. LS

## 2023-10-15 NOTE — Telephone Encounter (Signed)
 noted

## 2023-11-13 ENCOUNTER — Other Ambulatory Visit: Payer: Self-pay | Admitting: Family Medicine

## 2023-11-13 DIAGNOSIS — F411 Generalized anxiety disorder: Secondary | ICD-10-CM

## 2023-11-15 ENCOUNTER — Telehealth: Payer: Self-pay

## 2023-11-15 ENCOUNTER — Other Ambulatory Visit (HOSPITAL_COMMUNITY): Payer: Self-pay

## 2023-11-15 NOTE — Telephone Encounter (Signed)
 Pharmacy Patient Advocate Encounter   Received notification from CoverMyMeds that prior authorization for Wegovy  is required/requested.   Insurance verification completed.   The patient is insured through [facility] .   Per test claim: Pharmacy is not enrolled in [place] state Medicaid. Previous approval was grace period claim.

## 2023-11-18 ENCOUNTER — Other Ambulatory Visit (HOSPITAL_COMMUNITY): Payer: Self-pay

## 2023-11-18 ENCOUNTER — Telehealth: Payer: Self-pay

## 2023-11-18 NOTE — Telephone Encounter (Signed)
 Pharmacy Patient Advocate Encounter   Received notification from CoverMyMeds that prior authorization for Wegovy   is required/requested.   Insurance verification completed.   The patient is insured through [facility] .   Per test claim: pharmacy not enrolled in [place] state Medicaid. See encounter 11/15/23.

## 2023-11-19 ENCOUNTER — Other Ambulatory Visit (HOSPITAL_COMMUNITY): Payer: Self-pay

## 2023-11-22 ENCOUNTER — Telehealth: Payer: Self-pay

## 2023-11-22 NOTE — Telephone Encounter (Signed)
 Please advise on PA for Wegovy . Per last message looks like patients insurance may have changed

## 2023-11-22 NOTE — Telephone Encounter (Signed)
 Copied from CRM [id number]. Topic: Clinical - Medication Prior Auth >> Nov 22, 2023 11:30 AM Loreda Rodriguez T wrote: Reason for CRM: patient called stated [facility] in [place] needs a PA for Wegovy  1.7 and 2.4. Please f/u with insurance.

## 2023-11-25 ENCOUNTER — Other Ambulatory Visit (HOSPITAL_COMMUNITY): Payer: Self-pay

## 2023-11-25 ENCOUNTER — Telehealth: Payer: Self-pay

## 2023-11-25 NOTE — Telephone Encounter (Signed)
 Pharmacy Patient Advocate Encounter   Received notification from Pt Calls Messages that prior authorization for Wegovy  2.4MG /0.75ML auto-injectors  is required/requested.   Insurance verification completed.   The patient is insured through [employer] .   Per test claim: PA required; PA submitted to above mentioned insurance via CoverMyMeds Key/confirmation #/EOC [id number] Status is pending

## 2023-11-26 ENCOUNTER — Encounter: Payer: Self-pay | Admitting: Family Medicine

## 2023-11-26 ENCOUNTER — Telehealth: Payer: Self-pay | Admitting: Family Medicine

## 2023-11-26 ENCOUNTER — Telehealth: Payer: Self-pay

## 2023-11-26 NOTE — Telephone Encounter (Signed)
 N/a

## 2023-11-26 NOTE — Telephone Encounter (Unsigned)
 Copied from CRM [id number]. Topic: Clinical - Medication Prior Auth >> Nov 26, 2023  4:06 PM Sasha H wrote: Reason for CRM: pt is still waiting to hear back about prior auth for Wegovy . Call back number [phone]

## 2023-11-26 NOTE — Telephone Encounter (Signed)
 Duplicate message

## 2023-11-26 NOTE — Telephone Encounter (Signed)
 Message from patient: [employer] [place]  is requiring another pre-auth for Wegovy  refill. I have continued to lose weight on this medication and I am down to 211 pounds. I feel that the intended benefit is being met.

## 2023-11-26 NOTE — Telephone Encounter (Signed)
 Pharmacy Patient Advocate Encounter  Received notification from [facility] that Prior Authorization for Wegovy  2.4mg /0.23mlhas been DENIED.  Full denial letter will be uploaded to the media tab. See denial reason below.   PA #/Case ID/Reference #: [id number]

## 2023-11-26 NOTE — Telephone Encounter (Signed)
 Pt called requesting to speak to the clinic regarding this prior auth denial. Please advise.    Best contact: [phone]

## 2023-11-27 NOTE — Telephone Encounter (Signed)
 Pt spoken to in Mychart/ separate encounter. LS

## 2023-12-09 ENCOUNTER — Telehealth: Payer: Self-pay

## 2023-12-09 ENCOUNTER — Other Ambulatory Visit (HOSPITAL_COMMUNITY): Payer: Self-pay

## 2023-12-09 NOTE — Telephone Encounter (Signed)
 Pharmacy Patient Advocate Encounter   Received notification from CoverMyMeds that prior authorization for Wegovy  2.4 is required/requested.   Insurance verification completed.   The patient is insured through [facility] .   Per test claim: 1.  See encounter 07/04/23 current Prior authorization expires 01/01/24   2. Unable to test bill [place]

## 2023-12-09 NOTE — Telephone Encounter (Signed)
 Please see telephone encounter from 11/26/23. Will close encounter.

## 2023-12-21 ENCOUNTER — Other Ambulatory Visit: Payer: Self-pay | Admitting: Family Medicine

## 2023-12-21 DIAGNOSIS — F411 Generalized anxiety disorder: Secondary | ICD-10-CM

## 2024-05-17 ENCOUNTER — Other Ambulatory Visit: Payer: Self-pay | Admitting: Family Medicine

## 2024-05-17 DIAGNOSIS — F339 Major depressive disorder, recurrent, unspecified: Secondary | ICD-10-CM

## 2024-05-17 DIAGNOSIS — F411 Generalized anxiety disorder: Secondary | ICD-10-CM

## 2026-03-08 ENCOUNTER — Ambulatory Visit: Payer: 59 | Admitting: Women's Health
